# Patient Record
Sex: Female | Born: 1935 | Race: White | Hispanic: No | Smoking: Former smoker
Health system: Southern US, Community
[De-identification: ages and names within clinical notes are randomized; demographics above are authoritative.]

## PROBLEM LIST (undated history)

## (undated) DIAGNOSIS — D649 Anemia, unspecified: Secondary | ICD-10-CM

## (undated) DIAGNOSIS — E119 Type 2 diabetes mellitus without complications: Secondary | ICD-10-CM

## (undated) DIAGNOSIS — E538 Deficiency of other specified B group vitamins: Secondary | ICD-10-CM

## (undated) DIAGNOSIS — R0600 Dyspnea, unspecified: Secondary | ICD-10-CM

## (undated) DIAGNOSIS — F419 Anxiety disorder, unspecified: Secondary | ICD-10-CM

## (undated) DIAGNOSIS — R296 Repeated falls: Secondary | ICD-10-CM

## (undated) DIAGNOSIS — R262 Difficulty in walking, not elsewhere classified: Secondary | ICD-10-CM

## (undated) DIAGNOSIS — G4700 Insomnia, unspecified: Secondary | ICD-10-CM

## (undated) DIAGNOSIS — C946 Myelodysplastic disease, not classified: Secondary | ICD-10-CM

## (undated) DIAGNOSIS — D630 Anemia in neoplastic disease: Secondary | ICD-10-CM

## (undated) DIAGNOSIS — E785 Hyperlipidemia, unspecified: Secondary | ICD-10-CM

## (undated) DIAGNOSIS — U071 COVID-19: Secondary | ICD-10-CM

## (undated) DIAGNOSIS — D47Z9 Other specified neoplasms of uncertain behavior of lymphoid, hematopoietic and related tissue: Secondary | ICD-10-CM

## (undated) DIAGNOSIS — I10 Essential (primary) hypertension: Secondary | ICD-10-CM

## (undated) DIAGNOSIS — K5792 Diverticulitis of intestine, part unspecified, without perforation or abscess without bleeding: Secondary | ICD-10-CM

## (undated) DIAGNOSIS — D469 Myelodysplastic syndrome, unspecified: Secondary | ICD-10-CM

## (undated) DIAGNOSIS — L409 Psoriasis, unspecified: Secondary | ICD-10-CM

## (undated) DIAGNOSIS — M109 Gout, unspecified: Secondary | ICD-10-CM

## (undated) DIAGNOSIS — I350 Nonrheumatic aortic (valve) stenosis: Secondary | ICD-10-CM

## (undated) DIAGNOSIS — E559 Vitamin D deficiency, unspecified: Secondary | ICD-10-CM

## (undated) DIAGNOSIS — M818 Other osteoporosis without current pathological fracture: Secondary | ICD-10-CM

## (undated) DIAGNOSIS — J449 Chronic obstructive pulmonary disease, unspecified: Secondary | ICD-10-CM

## (undated) DIAGNOSIS — W19XXXA Unspecified fall, initial encounter: Secondary | ICD-10-CM

## (undated) DIAGNOSIS — N183 Chronic kidney disease, stage 3 unspecified: Secondary | ICD-10-CM

## (undated) DIAGNOSIS — I509 Heart failure, unspecified: Secondary | ICD-10-CM

## (undated) DIAGNOSIS — M545 Low back pain, unspecified: Secondary | ICD-10-CM

## (undated) HISTORY — DX: Myelodysplastic syndrome, unspecified: D46.9

## (undated) HISTORY — PX: CHOLECYSTECTOMY: SHX55

## (undated) HISTORY — DX: Myelodysplastic disease, not classified: C94.6

## (undated) HISTORY — DX: Other osteoporosis without current pathological fracture: M81.8

## (undated) HISTORY — DX: Anemia in neoplastic disease: D63.0

## (undated) HISTORY — DX: Anemia, unspecified: D64.9

## (undated) HISTORY — DX: Insomnia, unspecified: G47.00

## (undated) HISTORY — PX: SHOULDER SURGERY: SHX246

## (undated) HISTORY — DX: Chronic obstructive pulmonary disease, unspecified: J44.9

## (undated) HISTORY — DX: Hyperlipidemia, unspecified: E78.5

## (undated) HISTORY — DX: Psoriasis, unspecified: L40.9

## (undated) HISTORY — DX: Essential (primary) hypertension: I10

## (undated) HISTORY — DX: Deficiency of other specified B group vitamins: E53.8

## (undated) HISTORY — PX: SKIN BIOPSY: SHX1

## (undated) HISTORY — DX: Type 2 diabetes mellitus without complications: E11.9

## (undated) HISTORY — DX: Diverticulitis of intestine, part unspecified, without perforation or abscess without bleeding: K57.92

## (undated) HISTORY — DX: Nonrheumatic aortic (valve) stenosis: I35.0

## (undated) HISTORY — DX: Other specified neoplasms of uncertain behavior of lymphoid, hematopoietic and related tissue: D47.Z9

## (undated) HISTORY — DX: Anxiety disorder, unspecified: F41.9

## (undated) HISTORY — PX: JOINT REPLACEMENT: SHX530

## (undated) HISTORY — DX: Low back pain, unspecified: M54.50

## (undated) HISTORY — DX: Gout, unspecified: M10.9

## (undated) HISTORY — DX: Vitamin D deficiency, unspecified: E55.9

## (undated) HISTORY — DX: Myelodysplastic disease, not elsewhere classified: C94.6

## (undated) HISTORY — DX: Difficulty in walking, not elsewhere classified: R26.2

## (undated) HISTORY — PX: FOOT SURGERY: SHX648

## (undated) HISTORY — PX: ABDOMINAL HYSTERECTOMY: SHX81

## (undated) HISTORY — PX: CARPAL TUNNEL RELEASE: SHX101

## (undated) MED FILL — Luspatercept-aamt For Subcutaneous Inj 75 MG: SUBCUTANEOUS | Qty: 1.3 | Status: AC

---

## 2013-11-01 DIAGNOSIS — S2241XA Multiple fractures of ribs, right side, initial encounter for closed fracture: Secondary | ICD-10-CM

## 2013-11-01 DIAGNOSIS — J939 Pneumothorax, unspecified: Secondary | ICD-10-CM

## 2013-11-01 HISTORY — DX: Multiple fractures of ribs, right side, initial encounter for closed fracture: S22.41XA

## 2013-11-01 HISTORY — DX: Pneumothorax, unspecified: J93.9

## 2015-12-10 ENCOUNTER — Other Ambulatory Visit (HOSPITAL_COMMUNITY)
Admission: RE | Admit: 2015-12-10 | Disposition: A | Discharge status: Home / Self Care | Payer: Self-pay | Source: Ambulatory Visit | Attending: Oncology | Admitting: Oncology

## 2015-12-10 LAB — BONE MARROW EXAM

## 2015-12-17 LAB — CHROMOSOME ANALYSIS, BONE MARROW

## 2015-12-23 ENCOUNTER — Encounter (HOSPITAL_COMMUNITY): Payer: Self-pay

## 2015-12-26 DIAGNOSIS — D461 Refractory anemia with ring sideroblasts: Secondary | ICD-10-CM | POA: Diagnosis not present

## 2016-03-25 DIAGNOSIS — D461 Refractory anemia with ring sideroblasts: Secondary | ICD-10-CM | POA: Diagnosis not present

## 2016-06-21 DIAGNOSIS — D4620 Refractory anemia with excess of blasts, unspecified: Secondary | ICD-10-CM | POA: Diagnosis not present

## 2016-08-31 DIAGNOSIS — Z724 Inappropriate diet and eating habits: Secondary | ICD-10-CM | POA: Diagnosis not present

## 2016-08-31 DIAGNOSIS — D461 Refractory anemia with ring sideroblasts: Secondary | ICD-10-CM | POA: Diagnosis not present

## 2016-08-31 DIAGNOSIS — I951 Orthostatic hypotension: Secondary | ICD-10-CM | POA: Diagnosis not present

## 2016-10-12 DIAGNOSIS — D461 Refractory anemia with ring sideroblasts: Secondary | ICD-10-CM | POA: Diagnosis not present

## 2017-01-04 DIAGNOSIS — D461 Refractory anemia with ring sideroblasts: Secondary | ICD-10-CM | POA: Diagnosis not present

## 2017-01-24 DIAGNOSIS — L0390 Cellulitis, unspecified: Secondary | ICD-10-CM

## 2017-01-24 DIAGNOSIS — L409 Psoriasis, unspecified: Secondary | ICD-10-CM | POA: Diagnosis not present

## 2017-01-24 DIAGNOSIS — D649 Anemia, unspecified: Secondary | ICD-10-CM | POA: Diagnosis not present

## 2017-01-25 DIAGNOSIS — L0390 Cellulitis, unspecified: Secondary | ICD-10-CM | POA: Diagnosis not present

## 2017-01-25 DIAGNOSIS — L409 Psoriasis, unspecified: Secondary | ICD-10-CM | POA: Diagnosis not present

## 2017-01-25 DIAGNOSIS — D649 Anemia, unspecified: Secondary | ICD-10-CM | POA: Diagnosis not present

## 2017-01-26 DIAGNOSIS — L409 Psoriasis, unspecified: Secondary | ICD-10-CM | POA: Diagnosis not present

## 2017-01-26 DIAGNOSIS — L0390 Cellulitis, unspecified: Secondary | ICD-10-CM | POA: Diagnosis not present

## 2017-01-26 DIAGNOSIS — D649 Anemia, unspecified: Secondary | ICD-10-CM | POA: Diagnosis not present

## 2017-03-31 DIAGNOSIS — D461 Refractory anemia with ring sideroblasts: Secondary | ICD-10-CM | POA: Diagnosis not present

## 2017-06-17 DIAGNOSIS — D649 Anemia, unspecified: Secondary | ICD-10-CM | POA: Diagnosis not present

## 2017-06-17 DIAGNOSIS — K5732 Diverticulitis of large intestine without perforation or abscess without bleeding: Secondary | ICD-10-CM | POA: Diagnosis not present

## 2017-06-17 DIAGNOSIS — A419 Sepsis, unspecified organism: Secondary | ICD-10-CM

## 2017-06-17 DIAGNOSIS — R509 Fever, unspecified: Secondary | ICD-10-CM | POA: Diagnosis not present

## 2017-06-17 DIAGNOSIS — L409 Psoriasis, unspecified: Secondary | ICD-10-CM | POA: Diagnosis not present

## 2017-06-17 DIAGNOSIS — D72825 Bandemia: Secondary | ICD-10-CM | POA: Diagnosis not present

## 2017-06-17 DIAGNOSIS — D729 Disorder of white blood cells, unspecified: Secondary | ICD-10-CM

## 2017-06-18 DIAGNOSIS — R509 Fever, unspecified: Secondary | ICD-10-CM | POA: Diagnosis not present

## 2017-06-18 DIAGNOSIS — A419 Sepsis, unspecified organism: Secondary | ICD-10-CM | POA: Diagnosis not present

## 2017-06-18 DIAGNOSIS — K5732 Diverticulitis of large intestine without perforation or abscess without bleeding: Secondary | ICD-10-CM

## 2017-06-18 DIAGNOSIS — D729 Disorder of white blood cells, unspecified: Secondary | ICD-10-CM | POA: Diagnosis not present

## 2017-06-18 DIAGNOSIS — D72825 Bandemia: Secondary | ICD-10-CM | POA: Diagnosis not present

## 2017-06-19 DIAGNOSIS — D72825 Bandemia: Secondary | ICD-10-CM | POA: Diagnosis not present

## 2017-06-19 DIAGNOSIS — A419 Sepsis, unspecified organism: Secondary | ICD-10-CM | POA: Diagnosis not present

## 2017-06-19 DIAGNOSIS — D729 Disorder of white blood cells, unspecified: Secondary | ICD-10-CM | POA: Diagnosis not present

## 2017-06-19 DIAGNOSIS — R509 Fever, unspecified: Secondary | ICD-10-CM | POA: Diagnosis not present

## 2017-06-20 DIAGNOSIS — D729 Disorder of white blood cells, unspecified: Secondary | ICD-10-CM | POA: Diagnosis not present

## 2017-06-20 DIAGNOSIS — R509 Fever, unspecified: Secondary | ICD-10-CM | POA: Diagnosis not present

## 2017-06-20 DIAGNOSIS — A419 Sepsis, unspecified organism: Secondary | ICD-10-CM | POA: Diagnosis not present

## 2017-06-20 DIAGNOSIS — D72825 Bandemia: Secondary | ICD-10-CM | POA: Diagnosis not present

## 2017-06-30 DIAGNOSIS — D461 Refractory anemia with ring sideroblasts: Secondary | ICD-10-CM

## 2017-09-22 DIAGNOSIS — D461 Refractory anemia with ring sideroblasts: Secondary | ICD-10-CM | POA: Diagnosis not present

## 2017-11-17 DIAGNOSIS — D461 Refractory anemia with ring sideroblasts: Secondary | ICD-10-CM | POA: Diagnosis not present

## 2017-11-28 DIAGNOSIS — R55 Syncope and collapse: Secondary | ICD-10-CM | POA: Diagnosis not present

## 2017-11-28 DIAGNOSIS — L409 Psoriasis, unspecified: Secondary | ICD-10-CM | POA: Diagnosis not present

## 2017-11-28 DIAGNOSIS — I10 Essential (primary) hypertension: Secondary | ICD-10-CM | POA: Diagnosis not present

## 2017-11-28 DIAGNOSIS — R5381 Other malaise: Secondary | ICD-10-CM | POA: Diagnosis not present

## 2017-11-28 DIAGNOSIS — E119 Type 2 diabetes mellitus without complications: Secondary | ICD-10-CM | POA: Diagnosis not present

## 2017-11-28 DIAGNOSIS — N390 Urinary tract infection, site not specified: Secondary | ICD-10-CM | POA: Diagnosis not present

## 2017-11-28 DIAGNOSIS — R29898 Other symptoms and signs involving the musculoskeletal system: Secondary | ICD-10-CM | POA: Diagnosis not present

## 2017-11-28 DIAGNOSIS — Z9181 History of falling: Secondary | ICD-10-CM | POA: Diagnosis not present

## 2017-11-29 DIAGNOSIS — R29898 Other symptoms and signs involving the musculoskeletal system: Secondary | ICD-10-CM | POA: Diagnosis not present

## 2017-11-29 DIAGNOSIS — R55 Syncope and collapse: Secondary | ICD-10-CM | POA: Diagnosis not present

## 2017-11-29 DIAGNOSIS — E119 Type 2 diabetes mellitus without complications: Secondary | ICD-10-CM | POA: Diagnosis not present

## 2017-11-29 DIAGNOSIS — R5381 Other malaise: Secondary | ICD-10-CM | POA: Diagnosis not present

## 2017-11-29 DIAGNOSIS — Z9181 History of falling: Secondary | ICD-10-CM | POA: Diagnosis not present

## 2017-11-29 DIAGNOSIS — L409 Psoriasis, unspecified: Secondary | ICD-10-CM | POA: Diagnosis not present

## 2017-11-29 DIAGNOSIS — N390 Urinary tract infection, site not specified: Secondary | ICD-10-CM | POA: Diagnosis not present

## 2017-11-29 DIAGNOSIS — I10 Essential (primary) hypertension: Secondary | ICD-10-CM | POA: Diagnosis not present

## 2017-12-12 ENCOUNTER — Encounter: Payer: Self-pay | Admitting: *Deleted

## 2017-12-12 ENCOUNTER — Encounter: Payer: Self-pay | Admitting: Cardiology

## 2017-12-12 DIAGNOSIS — D469 Myelodysplastic syndrome, unspecified: Secondary | ICD-10-CM | POA: Insufficient documentation

## 2017-12-12 DIAGNOSIS — I10 Essential (primary) hypertension: Secondary | ICD-10-CM | POA: Insufficient documentation

## 2017-12-12 DIAGNOSIS — D649 Anemia, unspecified: Secondary | ICD-10-CM | POA: Insufficient documentation

## 2017-12-12 DIAGNOSIS — L409 Psoriasis, unspecified: Secondary | ICD-10-CM | POA: Insufficient documentation

## 2017-12-12 DIAGNOSIS — E119 Type 2 diabetes mellitus without complications: Secondary | ICD-10-CM | POA: Insufficient documentation

## 2017-12-12 DIAGNOSIS — I350 Nonrheumatic aortic (valve) stenosis: Secondary | ICD-10-CM | POA: Insufficient documentation

## 2017-12-12 DIAGNOSIS — J449 Chronic obstructive pulmonary disease, unspecified: Secondary | ICD-10-CM | POA: Insufficient documentation

## 2017-12-14 ENCOUNTER — Ambulatory Visit: Payer: Medicare Other | Admitting: Cardiology

## 2018-02-09 DIAGNOSIS — D461 Refractory anemia with ring sideroblasts: Secondary | ICD-10-CM | POA: Diagnosis not present

## 2018-05-09 DIAGNOSIS — D461 Refractory anemia with ring sideroblasts: Secondary | ICD-10-CM | POA: Diagnosis not present

## 2018-08-01 DIAGNOSIS — D461 Refractory anemia with ring sideroblasts: Secondary | ICD-10-CM | POA: Diagnosis not present

## 2018-10-24 DIAGNOSIS — D461 Refractory anemia with ring sideroblasts: Secondary | ICD-10-CM

## 2018-10-24 DIAGNOSIS — L03115 Cellulitis of right lower limb: Secondary | ICD-10-CM

## 2018-12-18 DIAGNOSIS — D461 Refractory anemia with ring sideroblasts: Secondary | ICD-10-CM | POA: Diagnosis not present

## 2019-03-13 DIAGNOSIS — D461 Refractory anemia with ring sideroblasts: Secondary | ICD-10-CM | POA: Diagnosis not present

## 2019-03-18 ENCOUNTER — Observation Stay (HOSPITAL_BASED_OUTPATIENT_CLINIC_OR_DEPARTMENT_OTHER): Payer: Medicare Other

## 2019-03-18 ENCOUNTER — Observation Stay (HOSPITAL_COMMUNITY)
Admission: AD | Admit: 2019-03-18 | Discharge: 2019-03-19 | Disposition: A | Discharge status: Home / Self Care | Payer: Medicare Other | Source: Other Acute Inpatient Hospital | Attending: Internal Medicine | Admitting: Internal Medicine

## 2019-03-18 ENCOUNTER — Encounter (HOSPITAL_COMMUNITY): Payer: Self-pay | Admitting: Internal Medicine

## 2019-03-18 DIAGNOSIS — Z833 Family history of diabetes mellitus: Secondary | ICD-10-CM | POA: Diagnosis not present

## 2019-03-18 DIAGNOSIS — R6889 Other general symptoms and signs: Secondary | ICD-10-CM

## 2019-03-18 DIAGNOSIS — Z66 Do not resuscitate: Secondary | ICD-10-CM | POA: Insufficient documentation

## 2019-03-18 DIAGNOSIS — J449 Chronic obstructive pulmonary disease, unspecified: Secondary | ICD-10-CM | POA: Insufficient documentation

## 2019-03-18 DIAGNOSIS — Z20822 Contact with and (suspected) exposure to covid-19: Secondary | ICD-10-CM | POA: Diagnosis present

## 2019-03-18 DIAGNOSIS — Z79899 Other long term (current) drug therapy: Secondary | ICD-10-CM | POA: Insufficient documentation

## 2019-03-18 DIAGNOSIS — F17200 Nicotine dependence, unspecified, uncomplicated: Secondary | ICD-10-CM | POA: Diagnosis not present

## 2019-03-18 DIAGNOSIS — I4891 Unspecified atrial fibrillation: Secondary | ICD-10-CM | POA: Diagnosis not present

## 2019-03-18 DIAGNOSIS — I350 Nonrheumatic aortic (valve) stenosis: Secondary | ICD-10-CM | POA: Diagnosis not present

## 2019-03-18 DIAGNOSIS — D469 Myelodysplastic syndrome, unspecified: Secondary | ICD-10-CM | POA: Insufficient documentation

## 2019-03-18 DIAGNOSIS — E119 Type 2 diabetes mellitus without complications: Secondary | ICD-10-CM

## 2019-03-18 DIAGNOSIS — L409 Psoriasis, unspecified: Secondary | ICD-10-CM | POA: Diagnosis not present

## 2019-03-18 DIAGNOSIS — I480 Paroxysmal atrial fibrillation: Secondary | ICD-10-CM | POA: Diagnosis not present

## 2019-03-18 DIAGNOSIS — Z8249 Family history of ischemic heart disease and other diseases of the circulatory system: Secondary | ICD-10-CM | POA: Insufficient documentation

## 2019-03-18 DIAGNOSIS — I10 Essential (primary) hypertension: Secondary | ICD-10-CM | POA: Insufficient documentation

## 2019-03-18 LAB — ECHOCARDIOGRAM COMPLETE

## 2019-03-18 LAB — HEMOGLOBIN A1C
Hgb A1c MFr Bld: 6.2 % — ABNORMAL HIGH (ref 4.8–5.6)
Mean Plasma Glucose: 131.24 mg/dL

## 2019-03-18 MED ORDER — TRIAMCINOLONE ACETONIDE 0.5 % EX OINT
TOPICAL_OINTMENT | Freq: Two times a day (BID) | CUTANEOUS | Status: DC | PRN
  Administered 2019-03-18: 16:00:00 via TOPICAL
  Filled 2019-03-18 (×2): qty 15

## 2019-03-18 MED ORDER — LISINOPRIL 5 MG PO TABS
5.0000 mg | ORAL_TABLET | Freq: Every day | ORAL | Status: DC
  Administered 2019-03-18 – 2019-03-19 (×2): 5 mg via ORAL
  Filled 2019-03-18 (×2): qty 1

## 2019-03-18 MED ORDER — TRIAMCINOLONE ACETONIDE 0.05 % EX OINT: TOPICAL_OINTMENT | Freq: Two times a day (BID) | CUTANEOUS | Status: DC | PRN

## 2019-03-18 MED ORDER — CLOBETASOL PROPIONATE 0.05 % EX CREA
TOPICAL_CREAM | Freq: Two times a day (BID) | CUTANEOUS | Status: DC | PRN
  Administered 2019-03-18: 16:00:00 via TOPICAL
  Filled 2019-03-18 (×2): qty 15

## 2019-03-18 MED ORDER — NICOTINE 14 MG/24HR TD PT24
14.0000 mg | MEDICATED_PATCH | Freq: Every day | TRANSDERMAL | Status: DC
  Filled 2019-03-18 (×2): qty 1

## 2019-03-18 MED ORDER — CLOBETASOL PROPIONATE EMULSION 0.05 % EX FOAM: 1.0000 "application " | Freq: Two times a day (BID) | CUTANEOUS | Status: DC | PRN

## 2019-03-18 MED ORDER — CLOBETASOL PROPIONATE 0.05 % EX CREA
TOPICAL_CREAM | Freq: Two times a day (BID) | CUTANEOUS | Status: DC
  Filled 2019-03-18: qty 15

## 2019-03-18 MED ORDER — ALBUTEROL SULFATE HFA 108 (90 BASE) MCG/ACT IN AERS
2.0000 | INHALATION_SPRAY | Freq: Four times a day (QID) | RESPIRATORY_TRACT | Status: DC | PRN
  Filled 2019-03-18: qty 6.7

## 2019-03-18 MED ORDER — MOMETASONE FURO-FORMOTEROL FUM 100-5 MCG/ACT IN AERO
2.0000 | INHALATION_SPRAY | Freq: Two times a day (BID) | RESPIRATORY_TRACT | Status: DC
  Administered 2019-03-18 – 2019-03-19 (×3): 2 via RESPIRATORY_TRACT
  Filled 2019-03-18: qty 8.8

## 2019-03-18 MED ORDER — ENOXAPARIN SODIUM 40 MG/0.4ML ~~LOC~~ SOLN
40.0000 mg | SUBCUTANEOUS | Status: DC
  Administered 2019-03-18 – 2019-03-19 (×2): 40 mg via SUBCUTANEOUS
  Filled 2019-03-18 (×3): qty 0.4

## 2019-03-18 MED ORDER — METOPROLOL SUCCINATE ER 50 MG PO TB24
50.0000 mg | ORAL_TABLET | Freq: Every day | ORAL | Status: DC
  Administered 2019-03-18: 50 mg via ORAL
  Filled 2019-03-18: qty 1

## 2019-03-18 MED ORDER — ONDANSETRON HCL 4 MG/2ML IJ SOLN
4.0000 mg | Freq: Four times a day (QID) | INTRAMUSCULAR | Status: DC | PRN
  Administered 2019-03-18: 4 mg via INTRAVENOUS
  Filled 2019-03-18 (×2): qty 2

## 2019-03-18 NOTE — H&P (Signed)
History and Physical    Stefanie Phelps HKV:[id number] DOB: 18-Jul-1930 DOA: 03/18/2019  PCP: Ocie Doyne., MD  Patient coming from: [facility].   I have personally briefly reviewed patient's old medical records available.   Chief Complaint: Weakness, difficulty breathing.  HPI: Stefanie Phelps is a 85 y.o. female with medical history significant of COPD, smoker, hypertension, myelodysplastic syndrome, psoriasis and currently on Humira injection, aortic stenosis who presented to the [facility] with weakness, found to have new onset A. fib, screen for COVID-19 and sent to the [facility] for COVID-19 cohort. Patient is hard of hearing, also discussed with her daughter over the phone to get more history. 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  She has been feeling weak last few days.  She was seen in the emergency room yesterday where she complained of feeling weak and feeling like passing out.  She was given Rocephin and discharged on ciprofloxacin.  Patient also complained of some shortness of breath.  She went home, felt more weaker and came back to emergency room.  A repeat evaluation was done with an EKG showed A. fib with heart rate of 114, no other abnormalities. No fever or chills.  On room air.  No wheezing.  She always have some congestion and dry cough because of COPD and a smoker.  Lives by herself.  Her daughter and son-in-law live nearby.  Nobody with COVID-19 exposure. Patient was given 10 mg of diltiazem in the ER, converted to sinus rhythm. COVID-19 sample was collected, patient was however stable on room air. Patient transferred [facility] because COVID-19 samples were collected. ED Course:  Afebrile.  On room air. Electrolytes normal.  Creatinine 1.2 which is at about her baseline. Urinalysis is essentially normal.  Lactic acid normal.  Calcium 10.  Troponin less than 0.01.  proBNP 613.  Repeat troponin less  than 0.01.  Magnesium 2.  TSH 1.25.  Free T4 1.3.  CT angiogram is normal.  No PE, no infiltrates, no consolidations. Hemoglobin 9.5.  Platelets 258.  WBC count is normal.  Differential is normal.    Review of Systems: As per HPI otherwise 10 point review of systems negative.    Past Medical History:  Diagnosis Date  . Anemia   . Aortic stenosis   . COPD (chronic obstructive pulmonary disease) ([place])   . Diabetes ([place])   . Hypertension   . Myelodysplastic syndrome ([place])   . Psoriasis     Past Surgical History:  Procedure Laterality Date  . ABDOMINAL HYSTERECTOMY    . CHOLECYSTECTOMY       reports that she has been smoking. She has never used smokeless tobacco. She reports that she does not drink alcohol or use drugs.  No Known Allergies  Family History  Problem Relation Age of Onset  . Hypertension Mother   . Diabetes Mother   . Heart disease Mother   . Hypertension Father   . Diabetes Father   . Prostate cancer Father   . Lung cancer Brother   . Heart disease Brother      Prior to Admission medications   Medication Sig Start Date End Date Taking? Authorizing Provider  Adalimumab 40 MG/0.4ML PNKT Inject 40 mg into the skin.    [provider]  cephALEXin (KEFLEX) 500 MG capsule Take 500 mg by mouth every 8 (eight) hours.    [provider]  CLOBETASOL PROPIONATE EMULSION EX  Apply topically 2 (two) times daily as needed.    [provider]  cyanocobalamin (,VITAMIN B-12,) 1000 MCG/ML injection Inject 1,000 mcg into the muscle every 30 (thirty) days.    [provider]  Fluticasone-Salmeterol (ADVAIR) 250-50 MCG/DOSE AEPB Inhale 1 puff into the lungs 2 (two) times daily.    [provider]  HYDROcodone-acetaminophen (NORCO) 10-325 MG tablet Take 1 tablet by mouth 4 (four) times daily as needed.    [provider]  loperamide (IMODIUM) 2 MG capsule Take 4 mg by mouth every 4 (four) hours as needed for diarrhea or  loose stools.    [provider]  ondansetron (ZOFRAN) 4 MG tablet Take 4 mg by mouth every 6 (six) hours as needed for nausea or vomiting.    [provider]  TRIAMCINOLONE ACETONIDE, TOP, 0.05 % OINT Apply topically 2 (two) times daily as needed.    [provider]  vitamin B-12 (CYANOCOBALAMIN) 1000 MCG tablet Take 1,000 mcg by mouth daily.    [provider]  Vitamin D, Ergocalciferol, (DRISDOL) 50000 units CAPS capsule Take 50,000 Units by mouth every 7 (seven) days.    [provider]    Physical Exam: Vitals:   03/18/19 1209  BP: (!) 141/48  Temp: 97.8 F (36.6 C)  TempSrc: Oral  SpO2: 95%    Constitutional: NAD, calm, comfortable Vitals:   03/18/19 1209  BP: (!) 141/48  Temp: 97.8 F (36.6 C)  TempSrc: Oral  SpO2: 95%   Eyes: PERRL, lids and conjunctivae normal Patient is comfortable on room air.  Not in any distress. ENMT: Mucous membranes are moist. Posterior pharynx clear of any exudate or lesions.Normal dentition.  Neck: normal, supple, no masses, no thyromegaly Respiratory: clear to auscultation bilaterally, no wheezing, no crackles. Normal respiratory effort. No accessory muscle use.  Cardiovascular: Regular rate and rhythm, no murmurs / rubs / gallops. No extremity edema. 2+ pedal pulses. No carotid bruits.  Abdomen: no tenderness, no masses palpated. No hepatosplenomegaly. Bowel sounds positive.  Musculoskeletal: no clubbing / cyanosis. No joint deformity upper and lower extremities. Good ROM, no contractures. Normal muscle tone.  Skin: no rashes, lesions, ulcers. No induration Neurologic: CN 2-12 grossly intact. Sensation intact, DTR normal. Strength 5/5 in all 4.  Psychiatric: Normal judgment and insight. Alert and oriented x 3. Normal mood.     Labs on Admission: I have personally reviewed following labs and imaging studies  CBC: No results for input(s): WBC, NEUTROABS, HGB, HCT, MCV, PLT in the last 168 hours.  Basic Metabolic Panel: No results for input(s): NA, K, CL, CO2, GLUCOSE, BUN, CREATININE, CALCIUM, MG, PHOS in the last 168 hours. GFR: CrCl cannot be calculated (No successful lab value found.). Liver Function Tests: No results for input(s): AST, ALT, ALKPHOS, BILITOT, PROT, ALBUMIN in the last 168 hours. No results for input(s): LIPASE, AMYLASE in the last 168 hours. No results for input(s): AMMONIA in the last 168 hours. Coagulation Profile: No results for input(s): INR, PROTIME in the last 168 hours. Cardiac Enzymes: No results for input(s): CKTOTAL, CKMB, CKMBINDEX, TROPONINI in the last 168 hours. BNP (last 3 results) No results for input(s): PROBNP in the last 8760 hours. HbA1C: No results for input(s): HGBA1C in the last 72 hours. CBG: No results for input(s): GLUCAP in the last 168 hours. Lipid Profile: No results for input(s): CHOL, HDL, LDLCALC, TRIG, CHOLHDL, LDLDIRECT in the last 72 hours. Thyroid Function Tests: No results for input(s): TSH, T4TOTAL, FREET4, T3FREE, THYROIDAB in  the last 72 hours. Anemia Panel: No results for input(s): VITAMINB12, FOLATE, FERRITIN, TIBC, IRON, RETICCTPCT in the last 72 hours. Urine analysis: No results found for: COLORURINE, APPEARANCEUR, LABSPEC, PHURINE, GLUCOSEU, HGBUR, BILIRUBINUR, KETONESUR, PROTEINUR, UROBILINOGEN, NITRITE, LEUKOCYTESUR  Radiological Exams on Admission: No results found.  EKG: Independently reviewed.   EKG from [facility] on 03/18/2019 shows Afib with VR of 114    EKG from [facility] for 05/02/2019 3 AM, sinus rhythm.  Assessment/Plan Principal Problem:   Atrial fibrillation with RVR (HCC) Active Problems:   Myelodysplastic syndrome (HCC)   Hypertension   COPD (chronic obstructive pulmonary disease) (HCC)   Aortic stenosis   Diabetes ([place])   Suspected Covid-19 Virus Infection   Smoker     1.  New onset A. fib:  Unknown whether patient has history of A. fib.  Patient is asymptomatic.   Denies any chest pain.  She does endorse occasional weakness and near syncopal episodes.  Will need to rule out symptomatic bradycardia or blockages. Admit to telemetry unit.  No evidence of acute coronary syndrome. Thyroid function panel was done and was normal. Currently rate controlled in sinus rhythm.  Will resume her home dose of metoprolol. We will need 2D echocardiogram for risk stratification. Diet-controlled diabetes, check A1c. No history of a stroke. Patient is not very interested to start anticoagulation, will risk stratify with A1c and 2D echocardiogram.  2.  COVID-19 suspected: Patient has no fever or URI symptoms.  She has no cough or pulmonary symptoms. Low risk COVID-19.  Contact and droplet precautions.  Close monitoring. Test sent from [facility] ER, will follow results.  3.  COPD with no exacerbation: Ongoing smoker. Nicotine patch.  Continue with steroid inhalers and albuterol inhaler.  Will avoid nebulizers. Counseled against smoking.  She is really not interested.  4.  Hypertension: Fairly stable.  Will resume home medications.  Discussed with patient's daughter over the phone. Patient does not want any aggressive treatment.  She even does not want to be treated even if she was found with cancers or any big diagnosis. She wants to be DNR/DNI.  DVT prophylaxis: Lovenox. Code Status: DNR Family Communication: Daughter on the phone Disposition Plan: Home. Consults called: None Admission status: Observation telemetry   Barb Merino MD [facility] Pager [phone]  If 7PM-7AM, please contact night-coverage [email or web address] Password [facility]  03/18/2019, 12:25 PM

## 2019-03-18 NOTE — Progress Notes (Addendum)
  Echocardiogram 2D Echocardiogram has been performed.  [name] 03/18/2019, 3:30 PM

## 2019-03-18 NOTE — Care Management (Signed)
This is a no charge note   Transfer from [facility] to [facility] tele per Dr. Elwyn Reach L. Monahan, Female, 85 y.o., March 29, 1930, MRN: [id number]  85 year old lady with past medical history of hypertension, diabetes mellitus, COPD, anemia, psoriasis, myelodysplastic syndrome, tobacco abuse, who presents with generalized weakness, dizziness and presyncope, found to have new onset A. fib with RVR.  Initially heart rate was 120-130.  Patient spontaneously converted to sinus rhythm, currently heart rate is about 60.  Patient also has shortness of breath, but no recent traveling or sick contact.  Patient goes to cancer center for treatment, ED physician is concerned for [place] and [place] test was sent out, therefore pt cannot be admitted to [facility].  Patient was found to have WBC 5.4, hemoglobin 9.5 which is close to baseline per ED physician, creatinine 1.2 which is at baseline per ED physician, temperature normal, blood pressure 190/90, RR 15, oxygen saturation 100% on room air.  CT angiogram is negative for PE and did not show infiltration.  Patient is accepted to telemetry bed for observation.   Please call manager of [facility] at [phone] when pt arrives to floor   Ivor Costa, MD  [facility]   If 7PM-7AM, please contact night-coverage [email or web address] Password [email or web address] 03/18/2019, 4:59 AM

## 2019-03-18 NOTE — Progress Notes (Addendum)
Pt has sudden onset of nausea, she did throw up once, stated feel like passing out.  RN put pt on 3L O2 via [place], BP stable, afib, HR 85. Paged Ghimire, MD and gave Zofran per order.  Pt stated she has had sudden onset of nausea in the past, going on for a few years, does not know why.  Pt felt better after receiving zofran. RN will continue to monitor closely.

## 2019-03-19 ENCOUNTER — Other Ambulatory Visit: Payer: Self-pay

## 2019-03-19 DIAGNOSIS — I10 Essential (primary) hypertension: Secondary | ICD-10-CM | POA: Diagnosis not present

## 2019-03-19 DIAGNOSIS — F17200 Nicotine dependence, unspecified, uncomplicated: Secondary | ICD-10-CM

## 2019-03-19 DIAGNOSIS — I4891 Unspecified atrial fibrillation: Secondary | ICD-10-CM | POA: Diagnosis not present

## 2019-03-19 DIAGNOSIS — I480 Paroxysmal atrial fibrillation: Secondary | ICD-10-CM | POA: Diagnosis not present

## 2019-03-19 DIAGNOSIS — J449 Chronic obstructive pulmonary disease, unspecified: Secondary | ICD-10-CM | POA: Diagnosis not present

## 2019-03-19 DIAGNOSIS — D469 Myelodysplastic syndrome, unspecified: Secondary | ICD-10-CM

## 2019-03-19 LAB — BASIC METABOLIC PANEL
Anion gap: 7 (ref 5–15)
BUN: 36 mg/dL — ABNORMAL HIGH (ref 8–23)
CO2: 22 mmol/L (ref 22–32)
Calcium: 9.6 mg/dL (ref 8.9–10.3)
Chloride: 108 mmol/L (ref 98–111)
Creatinine, Ser: 1.75 mg/dL — ABNORMAL HIGH (ref 0.44–1.00)
GFR calc Af Amer: 30 mL/min — ABNORMAL LOW (ref >60)
GFR calc non Af Amer: 26 mL/min — ABNORMAL LOW (ref >60)
Glucose, Bld: 106 mg/dL — ABNORMAL HIGH (ref 70–99)
Potassium: 4.5 mmol/L (ref 3.5–5.1)
Sodium: 137 mmol/L (ref 135–145)

## 2019-03-19 LAB — CBC
HCT: 29.8 % — ABNORMAL LOW (ref 36.0–46.0)
Hemoglobin: 9.5 g/dL — ABNORMAL LOW (ref 12.0–15.0)
MCH: 30.6 pg (ref 26.0–34.0)
MCHC: 31.9 g/dL (ref 30.0–36.0)
MCV: 96.1 fL (ref 80.0–100.0)
Platelets: 247 10*3/uL (ref 150–400)
RBC: 3.1 MIL/uL — ABNORMAL LOW (ref 3.87–5.11)
RDW: 17.8 % — ABNORMAL HIGH (ref 11.5–15.5)
WBC: 5.4 10*3/uL (ref 4.0–10.5)
nRBC: 0.4 % — ABNORMAL HIGH (ref 0.0–0.2)

## 2019-03-19 LAB — GLUCOSE, CAPILLARY: Glucose-Capillary: 91 mg/dL (ref 70–99)

## 2019-03-19 MED ORDER — METOPROLOL SUCCINATE ER 50 MG PO TB24: 50.0000 mg | ORAL_TABLET | Freq: Every day | ORAL | 0 refills | Status: DC

## 2019-03-19 MED ORDER — METOPROLOL TARTRATE 25 MG PO TABS
25.0000 mg | ORAL_TABLET | Freq: Two times a day (BID) | ORAL | Status: DC
  Administered 2019-03-19: 25 mg via ORAL
  Filled 2019-03-19: qty 1

## 2019-03-19 NOTE — Plan of Care (Signed)
Problem: Education: Goal: Knowledge of General Education information will improve Description Including pain rating scale, medication(s)/side effects and non-pharmacologic comfort measures 03/19/2019 1225 by Linton Flemings, RN Outcome: Adequate for Discharge 03/19/2019 1007 by Linton Flemings, RN Outcome: Adequate for Discharge 03/19/2019 1005 by Linton Flemings, RN Outcome: Progressing   Problem: Health Behavior/Discharge Planning: Goal: Ability to manage health-related needs will improve 03/19/2019 1225 by Linton Flemings, RN Outcome: Adequate for Discharge 03/19/2019 1007 by Linton Flemings, RN Outcome: Adequate for Discharge 03/19/2019 1005 by Linton Flemings, RN Outcome: Progressing   Problem: Clinical Measurements: Goal: Ability to maintain clinical measurements within normal limits will improve 03/19/2019 1225 by Linton Flemings, RN Outcome: Adequate for Discharge 03/19/2019 1007 by Linton Flemings, RN Outcome: Adequate for Discharge 03/19/2019 1005 by Linton Flemings, RN Outcome: Progressing Goal: Will remain free from infection 03/19/2019 1225 by Linton Flemings, RN Outcome: Adequate for Discharge 03/19/2019 1007 by Linton Flemings, RN Outcome: Adequate for Discharge 03/19/2019 1005 by Linton Flemings, RN Outcome: Progressing Goal: Diagnostic test results will improve 03/19/2019 1225 by Linton Flemings, RN Outcome: Adequate for Discharge 03/19/2019 1007 by Linton Flemings, RN Outcome: Adequate for Discharge 03/19/2019 1005 by Linton Flemings, RN Outcome: Progressing Goal: Respiratory complications will improve 03/19/2019 1225 by Linton Flemings, RN Outcome: Adequate for Discharge 03/19/2019 1007 by Linton Flemings, RN Outcome: Adequate for Discharge 03/19/2019 1005 by Linton Flemings, RN Outcome: Progressing Goal: Cardiovascular complication will be avoided 03/19/2019 1225 by Linton Flemings, RN Outcome: Adequate for Discharge 03/19/2019 1007 by Linton Flemings, RN Outcome: Adequate for  Discharge 03/19/2019 1005 by Linton Flemings, RN Outcome: Progressing   Problem: Activity: Goal: Risk for activity intolerance will decrease 03/19/2019 1225 by Linton Flemings, RN Outcome: Adequate for Discharge 03/19/2019 1007 by Linton Flemings, RN Outcome: Adequate for Discharge 03/19/2019 1005 by Linton Flemings, RN Outcome: Progressing   Problem: Nutrition: Goal: Adequate nutrition will be maintained 03/19/2019 1225 by Linton Flemings, RN Outcome: Adequate for Discharge 03/19/2019 1007 by Linton Flemings, RN Outcome: Adequate for Discharge 03/19/2019 1005 by Linton Flemings, RN Outcome: Progressing   Problem: Coping: Goal: Level of anxiety will decrease 03/19/2019 1225 by Linton Flemings, RN Outcome: Adequate for Discharge 03/19/2019 1007 by Linton Flemings, RN Outcome: Adequate for Discharge 03/19/2019 1005 by Linton Flemings, RN Outcome: Progressing   Problem: Elimination: Goal: Will not experience complications related to bowel motility 03/19/2019 1225 by Linton Flemings, RN Outcome: Adequate for Discharge 03/19/2019 1007 by Linton Flemings, RN Outcome: Adequate for Discharge 03/19/2019 1005 by Linton Flemings, RN Outcome: Progressing Goal: Will not experience complications related to urinary retention 03/19/2019 1225 by Linton Flemings, RN Outcome: Adequate for Discharge 03/19/2019 1007 by Linton Flemings, RN Outcome: Adequate for Discharge 03/19/2019 1005 by Linton Flemings, RN Outcome: Progressing   Problem: Pain Managment: Goal: General experience of comfort will improve 03/19/2019 1225 by Linton Flemings, RN Outcome: Adequate for Discharge 03/19/2019 1007 by Linton Flemings, RN Outcome: Adequate for Discharge 03/19/2019 1005 by Linton Flemings, RN Outcome: Progressing   Problem: Safety: Goal: Ability to remain free from injury will improve 03/19/2019 1225 by Linton Flemings, RN Outcome: Adequate for Discharge 03/19/2019 1007 by Linton Flemings, RN Outcome: Adequate for  Discharge 03/19/2019 1005 by Linton Flemings, RN Outcome: Progressing   Problem: Skin Integrity: Goal: Risk for impaired skin integrity will  decrease 03/19/2019 1225 by Linton Flemings, RN Outcome: Adequate for Discharge 03/19/2019 1007 by Linton Flemings, RN Outcome: Adequate for Discharge 03/19/2019 1005 by Linton Flemings, RN Outcome: Progressing

## 2019-03-19 NOTE — Care Management Obs Status (Signed)
[place] NOTIFICATION   Patient Details  Name: Stefanie Phelps MRN: [id number] Date of Birth: [birth year]   Medicare Observation Status Notification Given:  Yes    Zenon Mayo, RN 03/19/2019, 11:15 AM

## 2019-03-19 NOTE — Discharge Summary (Signed)
Physician Discharge Summary  Darothy Courtright JME:[id number] DOB: Aug 29, 1930 DOA: 03/18/2019  PCP: Ocie Doyne., MD  Admit date: 03/18/2019 Discharge date: 03/19/2019  Admitted From: Observation Disposition: home  Recommendations for Outpatient Follow-up:  1. Follow up with PCP in 1-2 weeks 2. Please obtain BMP/CBC in one week 3. Please follow up on the following pending results:  Home Health:No Equipment/Devices:no  Discharge Condition:Stable CODE STATUS:DNR Diet recommendation: Cardiac diet  Brief/Interim Summary: Per admitting physician Ajee Starlit Raburn is a 85 y.o. female with medical history significant of COPD, smoker, hypertension, myelodysplastic syndrome, psoriasis and currently on Humira injection, aortic stenosis who presented to the [facility] with weakness, found to have new onset A. fib, screen for COVID-19 and sent to the [facility] for COVID-19 cohort. Patient is hard of hearing, also discussed with her daughter over the phone to get more history. 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  She has been feeling weak last few days.  She was seen in the emergency room yesterday where she complained of feeling weak and feeling like passing out.  She was given Rocephin and discharged on ciprofloxacin.  Patient also complained of some shortness of breath.  She went home, felt more weaker and came back to emergency room.  A repeat evaluation was done with an EKG showed A. fib with heart rate of 114, no other abnormalities. No fever or chills.  On room air.  No wheezing.  She always have some congestion and dry cough because of COPD and a smoker.  Lives by herself.  Her daughter and son-in-law live nearby.  Nobody with COVID-19 exposure. Patient was given 10 mg of diltiazem in the ER, converted to sinus rhythm. COVID-19 sample was collected, patient was however stable on room air. Patient transferred to [facility] because  COVID-19 samples were collected. ED Course:  Afebrile.  On room air. Electrolytes normal.  Creatinine 1.2 which is at about her baseline. Urinalysis is essentially normal.  Lactic acid normal.  Calcium 10.  Troponin less than 0.01.  proBNP 613.  Repeat troponin less than 0.01.  Magnesium 2.  TSH 1.25.  Free T4 1.3.  CT angiogram is normal.  No PE, no infiltrates, no consolidations. Hemoglobin 9.5.  Platelets 258.  WBC count is normal.  Differential is normal.  Hospital course: Patient was a transfer from [facility] where she presented with shortness of breath.  Initially she was seen earlier today at [facility] ER and discharged home on ciprofloxacin.  She felt more weak at home and represented to the ER was found to be in A. fib with RVR with a rate of 114.  This is noted above in the admitting history and physical.  Apparently patient converted to normal sinus rhythm at outside facility but subsequently presented to [facility] because they had collected a COVID-19 sample for rule out.  Here with: Patient was heart rate was well controlled and we continued a beta-blocker and increased to 50 long-acting daily.  She was on room air without any respiratory compromise she does have a chronic cough and chronic shortness of breath because she has a COPD patient who continues to smoke.  An echo was done which showed normal EF 55 to 60% with mild this diastolic dysfunction..  Patient was stable be discharged home she was instructed on quarantine for 14 days and follow-up call for testing.  Discharge Diagnoses:  Principal Problem:   Atrial fibrillation with  RVR ([place]) Active Problems:   Myelodysplastic syndrome (HCC)   Hypertension   COPD (chronic obstructive pulmonary disease) ([place])   Aortic stenosis   Diabetes ([place])   Suspected Covid-19 Virus Infection   Smoker    Discharge Instructions  Discharge Instructions    Call MD for:   Complete by:  As directed    [place]   Call MD for:  difficulty breathing, headache or visual disturbances   Complete by:  As directed    Call MD for:  persistant nausea and vomiting   Complete by:  As directed    Call MD for:  severe uncontrolled pain   Complete by:  As directed    Call MD for:  temperature >100.4   Complete by:  As directed    Diet - low sodium heart healthy   Complete by:  As directed    Increase activity slowly   Complete by:  As directed      Allergies as of 03/19/2019   No Known Allergies     Medication List    TAKE these medications   Adalimumab 40 MG/0.4ML Pnkt Inject 40 mg into the skin every Sunday.   allopurinol 300 MG tablet Commonly known as:  ZYLOPRIM Take 300 mg by mouth daily.   B-12 Compliance Injection 1000 MCG/ML Kit Generic drug:  Cyanocobalamin Inject 1,000 mcg as directed every 30 (thirty) days.   CLOBETASOL PROPIONATE EMULSION EX Apply 1 application topically 2 (two) times daily as needed (legs).   enalapril 20 MG tablet Commonly known as:  VASOTEC Take 20 mg by mouth daily.   Fluticasone-Salmeterol 250-50 MCG/DOSE Aepb Commonly known as:  ADVAIR Inhale 1 puff into the lungs 2 (two) times daily.   furosemide 20 MG tablet Commonly known as:  LASIX Take 20 mg by mouth daily.   hydrALAZINE 25 MG tablet Commonly known as:  APRESOLINE Take 25 mg by mouth 2 (two) times daily.   HYDROcodone-acetaminophen 10-325 MG tablet Commonly known as:  NORCO Take 1 tablet by mouth 4 (four) times daily as needed.   lovastatin 40 MG tablet Commonly known as:  MEVACOR Take 40 mg by mouth daily.   metoprolol succinate 50 MG 24 hr tablet Commonly known as:  TOPROL-XL Take 1 tablet (50 mg total) by mouth daily for 30 days. What changed:    medication strength  how much to take   TRIAMCINOLONE ACETONIDE (TOP) 0.05 % Oint Apply 1 application topically 2 (two) times daily as needed (legs).   Vitamin D (Ergocalciferol) 1.25 MG (50000 UT) Caps capsule Commonly  known as:  DRISDOL Take 50,000 Units by mouth every Sunday.       No Known Allergies  Consultations:  None   Procedures/Studies:  No results found.    Subjective:   Discharge Exam: Vitals:   03/18/19 2100 03/19/19 0754  BP: 115/65 116/87  Pulse: 66 (!) 59  Resp: 16 20  Temp: 98.4 F (36.9 C) 97.8 F (36.6 C)  SpO2: 100% 97%   Vitals:   03/18/19 1714 03/18/19 1736 03/18/19 2100 03/19/19 0754  BP:  (!) 111/54 115/65 116/87  Pulse:   66 (!) 59  Resp:   16 20  Temp: 98.1 F (36.7 C)  98.4 F (36.9 C) 97.8 F (36.6 C)  TempSrc: Oral  Oral Oral  SpO2:  100% 100% 97%    General: Pt is alert, awake, not in acute distress Cardiovascular: RRR, S1/S2 +, no rubs, no gallops Respiratory:  CTA bilaterally, no wheezing, no rhonchi, no coughing or sob on my eval Abdominal: Soft, NT, ND, bowel sounds + Extremities: no edema, no cyanosis    The results of significant diagnostics from this hospitalization (including imaging, microbiology, ancillary and laboratory) are listed below for reference.     Microbiology: No results found for this or any previous visit (from the past 240 hour(s)).   Labs: BNP (last 3 results) No results for input(s): BNP in the last 8760 hours. Basic Metabolic Panel: Recent Labs  Lab 03/19/19 0740  NA 137  K 4.5  CL 108  CO2 22  GLUCOSE 106*  BUN 36*  CREATININE 1.75*  CALCIUM 9.6   Liver Function Tests: No results for input(s): AST, ALT, ALKPHOS, BILITOT, PROT, ALBUMIN in the last 168 hours. No results for input(s): LIPASE, AMYLASE in the last 168 hours. No results for input(s): AMMONIA in the last 168 hours. CBC: Recent Labs  Lab 03/19/19 0740  WBC 5.4  HGB 9.5*  HCT 29.8*  MCV 96.1  PLT 247   Cardiac Enzymes: No results for input(s): CKTOTAL, CKMB, CKMBINDEX, TROPONINI in the last 168 hours. BNP: Invalid input(s): POCBNP CBG: Recent Labs  Lab 03/19/19 0747  GLUCAP 91   D-Dimer No results for input(s): DDIMER  in the last 72 hours. Hgb A1c Recent Labs    03/18/19 1234  HGBA1C 6.2*   Lipid Profile No results for input(s): CHOL, HDL, LDLCALC, TRIG, CHOLHDL, LDLDIRECT in the last 72 hours. Thyroid function studies No results for input(s): TSH, T4TOTAL, T3FREE, THYROIDAB in the last 72 hours.  Invalid input(s): FREET3 Anemia work up No results for input(s): VITAMINB12, FOLATE, FERRITIN, TIBC, IRON, RETICCTPCT in the last 72 hours. Urinalysis No results found for: COLORURINE, APPEARANCEUR, LABSPEC, [place], GLUCOSEU, HGBUR, BILIRUBINUR, KETONESUR, PROTEINUR, UROBILINOGEN, NITRITE, LEUKOCYTESUR Sepsis Labs Invalid input(s): PROCALCITONIN,  WBC,  LACTICIDVEN Microbiology No results found for this or any previous visit (from the past 240 hour(s)).   Time coordinating discharge: Over 30 minutes  SIGNED:   Nicolette Bang, MD  [facility] 03/19/2019, 11:03 AM Pager   If 7PM-7AM, please contact night-coverage [email or web address] Password [email or web address]

## 2019-03-19 NOTE — Plan of Care (Signed)
  Problem: Education: Goal: Knowledge of General Education information will improve Description Including pain rating scale, medication(s)/side effects and non-pharmacologic comfort measures 03/19/2019 1007 by Linton Flemings, RN Outcome: Adequate for Discharge 03/19/2019 1005 by Linton Flemings, RN Outcome: Progressing   Problem: Health Behavior/Discharge Planning: Goal: Ability to manage health-related needs will improve 03/19/2019 1007 by Linton Flemings, RN Outcome: Adequate for Discharge 03/19/2019 1005 by Linton Flemings, RN Outcome: Progressing   Problem: Clinical Measurements: Goal: Ability to maintain clinical measurements within normal limits will improve 03/19/2019 1007 by Linton Flemings, RN Outcome: Adequate for Discharge 03/19/2019 1005 by Linton Flemings, RN Outcome: Progressing Goal: Will remain free from infection 03/19/2019 1007 by Linton Flemings, RN Outcome: Adequate for Discharge 03/19/2019 1005 by Linton Flemings, RN Outcome: Progressing Goal: Diagnostic test results will improve 03/19/2019 1007 by Linton Flemings, RN Outcome: Adequate for Discharge 03/19/2019 1005 by Linton Flemings, RN Outcome: Progressing Goal: Respiratory complications will improve 03/19/2019 1007 by Linton Flemings, RN Outcome: Adequate for Discharge 03/19/2019 1005 by Linton Flemings, RN Outcome: Progressing Goal: Cardiovascular complication will be avoided 03/19/2019 1007 by Linton Flemings, RN Outcome: Adequate for Discharge 03/19/2019 1005 by Linton Flemings, RN Outcome: Progressing   Problem: Activity: Goal: Risk for activity intolerance will decrease 03/19/2019 1007 by Linton Flemings, RN Outcome: Adequate for Discharge 03/19/2019 1005 by Linton Flemings, RN Outcome: Progressing   Problem: Nutrition: Goal: Adequate nutrition will be maintained 03/19/2019 1007 by Linton Flemings, RN Outcome: Adequate for Discharge 03/19/2019 1005 by Linton Flemings, RN Outcome: Progressing   Problem:  Coping: Goal: Level of anxiety will decrease 03/19/2019 1007 by Linton Flemings, RN Outcome: Adequate for Discharge 03/19/2019 1005 by Linton Flemings, RN Outcome: Progressing   Problem: Elimination: Goal: Will not experience complications related to bowel motility 03/19/2019 1007 by Linton Flemings, RN Outcome: Adequate for Discharge 03/19/2019 1005 by Linton Flemings, RN Outcome: Progressing Goal: Will not experience complications related to urinary retention 03/19/2019 1007 by Linton Flemings, RN Outcome: Adequate for Discharge 03/19/2019 1005 by Linton Flemings, RN Outcome: Progressing   Problem: Pain Managment: Goal: General experience of comfort will improve 03/19/2019 1007 by Linton Flemings, RN Outcome: Adequate for Discharge 03/19/2019 1005 by Linton Flemings, RN Outcome: Progressing   Problem: Safety: Goal: Ability to remain free from injury will improve 03/19/2019 1007 by Linton Flemings, RN Outcome: Adequate for Discharge 03/19/2019 1005 by Linton Flemings, RN Outcome: Progressing   Problem: Skin Integrity: Goal: Risk for impaired skin integrity will decrease 03/19/2019 1007 by Linton Flemings, RN Outcome: Adequate for Discharge 03/19/2019 1005 by Linton Flemings, RN Outcome: Progressing

## 2019-03-19 NOTE — Plan of Care (Signed)

## 2019-03-19 NOTE — Progress Notes (Signed)
Patient discharged home with family, discharged instruction given to patient, education given on COVID 69 quarantine until the result back. All questions answered and concerns addressed. Patient verbalized understanding. Patient was given a surgical mask for transfer to her car.

## 2019-06-05 DIAGNOSIS — D461 Refractory anemia with ring sideroblasts: Secondary | ICD-10-CM

## 2019-07-31 DIAGNOSIS — C946 Myelodysplastic disease, not classified: Secondary | ICD-10-CM

## 2019-07-31 DIAGNOSIS — D649 Anemia, unspecified: Secondary | ICD-10-CM | POA: Diagnosis not present

## 2019-12-20 DIAGNOSIS — E538 Deficiency of other specified B group vitamins: Secondary | ICD-10-CM | POA: Diagnosis not present

## 2019-12-20 DIAGNOSIS — C946 Myelodysplastic disease, not classified: Secondary | ICD-10-CM | POA: Diagnosis not present

## 2019-12-20 DIAGNOSIS — E785 Hyperlipidemia, unspecified: Secondary | ICD-10-CM | POA: Diagnosis not present

## 2019-12-20 DIAGNOSIS — I10 Essential (primary) hypertension: Secondary | ICD-10-CM | POA: Diagnosis not present

## 2019-12-20 DIAGNOSIS — J449 Chronic obstructive pulmonary disease, unspecified: Secondary | ICD-10-CM | POA: Diagnosis not present

## 2019-12-20 DIAGNOSIS — I50810 Right heart failure, unspecified: Secondary | ICD-10-CM | POA: Diagnosis not present

## 2019-12-20 DIAGNOSIS — M109 Gout, unspecified: Secondary | ICD-10-CM | POA: Diagnosis not present

## 2019-12-20 DIAGNOSIS — N183 Chronic kidney disease, stage 3 unspecified: Secondary | ICD-10-CM | POA: Diagnosis not present

## 2019-12-20 DIAGNOSIS — L4050 Arthropathic psoriasis, unspecified: Secondary | ICD-10-CM | POA: Diagnosis not present

## 2019-12-27 DIAGNOSIS — E559 Vitamin D deficiency, unspecified: Secondary | ICD-10-CM | POA: Diagnosis not present

## 2019-12-27 DIAGNOSIS — D461 Refractory anemia with ring sideroblasts: Secondary | ICD-10-CM | POA: Diagnosis not present

## 2019-12-27 DIAGNOSIS — E119 Type 2 diabetes mellitus without complications: Secondary | ICD-10-CM | POA: Diagnosis not present

## 2019-12-27 DIAGNOSIS — E785 Hyperlipidemia, unspecified: Secondary | ICD-10-CM | POA: Diagnosis not present

## 2019-12-27 DIAGNOSIS — D649 Anemia, unspecified: Secondary | ICD-10-CM | POA: Diagnosis not present

## 2020-01-17 DIAGNOSIS — D461 Refractory anemia with ring sideroblasts: Secondary | ICD-10-CM | POA: Diagnosis not present

## 2020-01-21 DIAGNOSIS — J449 Chronic obstructive pulmonary disease, unspecified: Secondary | ICD-10-CM | POA: Diagnosis not present

## 2020-01-21 DIAGNOSIS — L4050 Arthropathic psoriasis, unspecified: Secondary | ICD-10-CM | POA: Diagnosis not present

## 2020-01-21 DIAGNOSIS — M109 Gout, unspecified: Secondary | ICD-10-CM | POA: Diagnosis not present

## 2020-01-21 DIAGNOSIS — I50810 Right heart failure, unspecified: Secondary | ICD-10-CM | POA: Diagnosis not present

## 2020-01-21 DIAGNOSIS — E785 Hyperlipidemia, unspecified: Secondary | ICD-10-CM | POA: Diagnosis not present

## 2020-01-21 DIAGNOSIS — I10 Essential (primary) hypertension: Secondary | ICD-10-CM | POA: Diagnosis not present

## 2020-01-21 DIAGNOSIS — E538 Deficiency of other specified B group vitamins: Secondary | ICD-10-CM | POA: Diagnosis not present

## 2020-01-21 DIAGNOSIS — C946 Myelodysplastic disease, not classified: Secondary | ICD-10-CM | POA: Diagnosis not present

## 2020-01-21 DIAGNOSIS — N183 Chronic kidney disease, stage 3 unspecified: Secondary | ICD-10-CM | POA: Diagnosis not present

## 2020-01-22 DIAGNOSIS — H3530 Unspecified macular degeneration: Secondary | ICD-10-CM | POA: Diagnosis not present

## 2020-02-07 DIAGNOSIS — D461 Refractory anemia with ring sideroblasts: Secondary | ICD-10-CM | POA: Diagnosis not present

## 2020-02-18 DIAGNOSIS — N183 Chronic kidney disease, stage 3 unspecified: Secondary | ICD-10-CM | POA: Diagnosis not present

## 2020-02-18 DIAGNOSIS — M109 Gout, unspecified: Secondary | ICD-10-CM | POA: Diagnosis not present

## 2020-02-18 DIAGNOSIS — C946 Myelodysplastic disease, not classified: Secondary | ICD-10-CM | POA: Diagnosis not present

## 2020-02-18 DIAGNOSIS — J449 Chronic obstructive pulmonary disease, unspecified: Secondary | ICD-10-CM | POA: Diagnosis not present

## 2020-02-18 DIAGNOSIS — I50810 Right heart failure, unspecified: Secondary | ICD-10-CM | POA: Diagnosis not present

## 2020-02-18 DIAGNOSIS — E538 Deficiency of other specified B group vitamins: Secondary | ICD-10-CM | POA: Diagnosis not present

## 2020-02-18 DIAGNOSIS — E785 Hyperlipidemia, unspecified: Secondary | ICD-10-CM | POA: Diagnosis not present

## 2020-02-18 DIAGNOSIS — L4050 Arthropathic psoriasis, unspecified: Secondary | ICD-10-CM | POA: Diagnosis not present

## 2020-02-18 DIAGNOSIS — I10 Essential (primary) hypertension: Secondary | ICD-10-CM | POA: Diagnosis not present

## 2020-02-28 DIAGNOSIS — D461 Refractory anemia with ring sideroblasts: Secondary | ICD-10-CM | POA: Diagnosis not present

## 2020-03-13 DIAGNOSIS — H353132 Nonexudative age-related macular degeneration, bilateral, intermediate dry stage: Secondary | ICD-10-CM | POA: Diagnosis not present

## 2020-03-13 DIAGNOSIS — H35371 Puckering of macula, right eye: Secondary | ICD-10-CM | POA: Diagnosis not present

## 2020-03-20 DIAGNOSIS — I10 Essential (primary) hypertension: Secondary | ICD-10-CM | POA: Diagnosis not present

## 2020-03-20 DIAGNOSIS — E785 Hyperlipidemia, unspecified: Secondary | ICD-10-CM | POA: Diagnosis not present

## 2020-03-20 DIAGNOSIS — M545 Low back pain: Secondary | ICD-10-CM | POA: Diagnosis not present

## 2020-03-20 DIAGNOSIS — E538 Deficiency of other specified B group vitamins: Secondary | ICD-10-CM | POA: Diagnosis not present

## 2020-03-20 DIAGNOSIS — Z139 Encounter for screening, unspecified: Secondary | ICD-10-CM | POA: Diagnosis not present

## 2020-03-20 DIAGNOSIS — N183 Chronic kidney disease, stage 3 unspecified: Secondary | ICD-10-CM | POA: Diagnosis not present

## 2020-03-20 DIAGNOSIS — C946 Myelodysplastic disease, not classified: Secondary | ICD-10-CM | POA: Diagnosis not present

## 2020-03-20 DIAGNOSIS — D461 Refractory anemia with ring sideroblasts: Secondary | ICD-10-CM | POA: Diagnosis not present

## 2020-03-20 DIAGNOSIS — J449 Chronic obstructive pulmonary disease, unspecified: Secondary | ICD-10-CM | POA: Diagnosis not present

## 2020-03-20 DIAGNOSIS — D649 Anemia, unspecified: Secondary | ICD-10-CM | POA: Diagnosis not present

## 2020-03-20 DIAGNOSIS — L4050 Arthropathic psoriasis, unspecified: Secondary | ICD-10-CM | POA: Diagnosis not present

## 2020-04-10 DIAGNOSIS — D461 Refractory anemia with ring sideroblasts: Secondary | ICD-10-CM | POA: Diagnosis not present

## 2020-04-18 DIAGNOSIS — N183 Chronic kidney disease, stage 3 unspecified: Secondary | ICD-10-CM | POA: Diagnosis not present

## 2020-04-18 DIAGNOSIS — C946 Myelodysplastic disease, not classified: Secondary | ICD-10-CM | POA: Diagnosis not present

## 2020-04-18 DIAGNOSIS — I50810 Right heart failure, unspecified: Secondary | ICD-10-CM | POA: Diagnosis not present

## 2020-04-18 DIAGNOSIS — M109 Gout, unspecified: Secondary | ICD-10-CM | POA: Diagnosis not present

## 2020-04-18 DIAGNOSIS — E785 Hyperlipidemia, unspecified: Secondary | ICD-10-CM | POA: Diagnosis not present

## 2020-04-18 DIAGNOSIS — I10 Essential (primary) hypertension: Secondary | ICD-10-CM | POA: Diagnosis not present

## 2020-04-18 DIAGNOSIS — E538 Deficiency of other specified B group vitamins: Secondary | ICD-10-CM | POA: Diagnosis not present

## 2020-04-18 DIAGNOSIS — J449 Chronic obstructive pulmonary disease, unspecified: Secondary | ICD-10-CM | POA: Diagnosis not present

## 2020-04-18 DIAGNOSIS — L4050 Arthropathic psoriasis, unspecified: Secondary | ICD-10-CM | POA: Diagnosis not present

## 2020-04-21 DIAGNOSIS — Z79899 Other long term (current) drug therapy: Secondary | ICD-10-CM | POA: Diagnosis not present

## 2020-04-21 DIAGNOSIS — L0390 Cellulitis, unspecified: Secondary | ICD-10-CM | POA: Diagnosis not present

## 2020-04-21 DIAGNOSIS — L409 Psoriasis, unspecified: Secondary | ICD-10-CM | POA: Diagnosis not present

## 2020-05-01 DIAGNOSIS — C946 Myelodysplastic disease, not classified: Secondary | ICD-10-CM | POA: Diagnosis not present

## 2020-05-19 DIAGNOSIS — M109 Gout, unspecified: Secondary | ICD-10-CM | POA: Diagnosis not present

## 2020-05-19 DIAGNOSIS — L4050 Arthropathic psoriasis, unspecified: Secondary | ICD-10-CM | POA: Diagnosis not present

## 2020-05-19 DIAGNOSIS — I50810 Right heart failure, unspecified: Secondary | ICD-10-CM | POA: Diagnosis not present

## 2020-05-19 DIAGNOSIS — I10 Essential (primary) hypertension: Secondary | ICD-10-CM | POA: Diagnosis not present

## 2020-05-19 DIAGNOSIS — E538 Deficiency of other specified B group vitamins: Secondary | ICD-10-CM | POA: Diagnosis not present

## 2020-05-19 DIAGNOSIS — J449 Chronic obstructive pulmonary disease, unspecified: Secondary | ICD-10-CM | POA: Diagnosis not present

## 2020-05-19 DIAGNOSIS — E785 Hyperlipidemia, unspecified: Secondary | ICD-10-CM | POA: Diagnosis not present

## 2020-05-19 DIAGNOSIS — C946 Myelodysplastic disease, not classified: Secondary | ICD-10-CM | POA: Diagnosis not present

## 2020-05-19 DIAGNOSIS — N183 Chronic kidney disease, stage 3 unspecified: Secondary | ICD-10-CM | POA: Diagnosis not present

## 2020-05-22 DIAGNOSIS — D461 Refractory anemia with ring sideroblasts: Secondary | ICD-10-CM | POA: Diagnosis not present

## 2020-05-22 DIAGNOSIS — E0869 Diabetes mellitus due to underlying condition with other specified complication: Secondary | ICD-10-CM | POA: Diagnosis not present

## 2020-05-22 DIAGNOSIS — Z5189 Encounter for other specified aftercare: Secondary | ICD-10-CM | POA: Diagnosis not present

## 2020-05-22 DIAGNOSIS — E785 Hyperlipidemia, unspecified: Secondary | ICD-10-CM | POA: Diagnosis not present

## 2020-06-12 DIAGNOSIS — D649 Anemia, unspecified: Secondary | ICD-10-CM | POA: Diagnosis not present

## 2020-06-12 DIAGNOSIS — D461 Refractory anemia with ring sideroblasts: Secondary | ICD-10-CM | POA: Diagnosis not present

## 2020-06-18 DIAGNOSIS — I10 Essential (primary) hypertension: Secondary | ICD-10-CM | POA: Diagnosis not present

## 2020-06-18 DIAGNOSIS — J449 Chronic obstructive pulmonary disease, unspecified: Secondary | ICD-10-CM | POA: Diagnosis not present

## 2020-06-18 DIAGNOSIS — C946 Myelodysplastic disease, not classified: Secondary | ICD-10-CM | POA: Diagnosis not present

## 2020-06-18 DIAGNOSIS — E785 Hyperlipidemia, unspecified: Secondary | ICD-10-CM | POA: Diagnosis not present

## 2020-06-18 DIAGNOSIS — N183 Chronic kidney disease, stage 3 unspecified: Secondary | ICD-10-CM | POA: Diagnosis not present

## 2020-06-18 DIAGNOSIS — I50810 Right heart failure, unspecified: Secondary | ICD-10-CM | POA: Diagnosis not present

## 2020-06-18 DIAGNOSIS — L4050 Arthropathic psoriasis, unspecified: Secondary | ICD-10-CM | POA: Diagnosis not present

## 2020-06-18 DIAGNOSIS — M109 Gout, unspecified: Secondary | ICD-10-CM | POA: Diagnosis not present

## 2020-06-18 DIAGNOSIS — E538 Deficiency of other specified B group vitamins: Secondary | ICD-10-CM | POA: Diagnosis not present

## 2020-06-19 DIAGNOSIS — D461 Refractory anemia with ring sideroblasts: Secondary | ICD-10-CM | POA: Diagnosis not present

## 2020-07-06 DIAGNOSIS — M791 Myalgia, unspecified site: Secondary | ICD-10-CM | POA: Diagnosis not present

## 2020-07-06 DIAGNOSIS — N179 Acute kidney failure, unspecified: Secondary | ICD-10-CM | POA: Diagnosis not present

## 2020-07-06 DIAGNOSIS — E860 Dehydration: Secondary | ICD-10-CM | POA: Diagnosis not present

## 2020-07-07 DIAGNOSIS — N179 Acute kidney failure, unspecified: Secondary | ICD-10-CM | POA: Diagnosis not present

## 2020-07-07 DIAGNOSIS — F17210 Nicotine dependence, cigarettes, uncomplicated: Secondary | ICD-10-CM | POA: Diagnosis not present

## 2020-07-07 DIAGNOSIS — E876 Hypokalemia: Secondary | ICD-10-CM | POA: Diagnosis not present

## 2020-07-07 DIAGNOSIS — I10 Essential (primary) hypertension: Secondary | ICD-10-CM | POA: Diagnosis not present

## 2020-07-07 DIAGNOSIS — J449 Chronic obstructive pulmonary disease, unspecified: Secondary | ICD-10-CM | POA: Diagnosis not present

## 2020-07-07 DIAGNOSIS — R531 Weakness: Secondary | ICD-10-CM | POA: Diagnosis not present

## 2020-07-07 DIAGNOSIS — E860 Dehydration: Secondary | ICD-10-CM | POA: Diagnosis not present

## 2020-07-07 DIAGNOSIS — M109 Gout, unspecified: Secondary | ICD-10-CM | POA: Diagnosis not present

## 2020-07-07 DIAGNOSIS — M791 Myalgia, unspecified site: Secondary | ICD-10-CM | POA: Diagnosis not present

## 2020-07-07 DIAGNOSIS — E872 Acidosis: Secondary | ICD-10-CM | POA: Diagnosis not present

## 2020-07-07 DIAGNOSIS — R0602 Shortness of breath: Secondary | ICD-10-CM | POA: Diagnosis not present

## 2020-07-07 DIAGNOSIS — D469 Myelodysplastic syndrome, unspecified: Secondary | ICD-10-CM | POA: Diagnosis not present

## 2020-07-14 DIAGNOSIS — D461 Refractory anemia with ring sideroblasts: Secondary | ICD-10-CM | POA: Diagnosis not present

## 2020-07-14 DIAGNOSIS — D649 Anemia, unspecified: Secondary | ICD-10-CM | POA: Diagnosis not present

## 2020-07-17 DIAGNOSIS — D461 Refractory anemia with ring sideroblasts: Secondary | ICD-10-CM | POA: Diagnosis not present

## 2020-07-18 DIAGNOSIS — M109 Gout, unspecified: Secondary | ICD-10-CM | POA: Diagnosis not present

## 2020-07-18 DIAGNOSIS — R5381 Other malaise: Secondary | ICD-10-CM | POA: Diagnosis not present

## 2020-07-18 DIAGNOSIS — Z1331 Encounter for screening for depression: Secondary | ICD-10-CM | POA: Diagnosis not present

## 2020-07-18 DIAGNOSIS — Z9181 History of falling: Secondary | ICD-10-CM | POA: Diagnosis not present

## 2020-07-18 DIAGNOSIS — L4050 Arthropathic psoriasis, unspecified: Secondary | ICD-10-CM | POA: Diagnosis not present

## 2020-07-18 DIAGNOSIS — I50810 Right heart failure, unspecified: Secondary | ICD-10-CM | POA: Diagnosis not present

## 2020-07-18 DIAGNOSIS — Z79899 Other long term (current) drug therapy: Secondary | ICD-10-CM | POA: Diagnosis not present

## 2020-07-18 DIAGNOSIS — I10 Essential (primary) hypertension: Secondary | ICD-10-CM | POA: Diagnosis not present

## 2020-07-18 DIAGNOSIS — E785 Hyperlipidemia, unspecified: Secondary | ICD-10-CM | POA: Diagnosis not present

## 2020-07-30 DIAGNOSIS — M109 Gout, unspecified: Secondary | ICD-10-CM | POA: Diagnosis not present

## 2020-07-30 DIAGNOSIS — I4891 Unspecified atrial fibrillation: Secondary | ICD-10-CM | POA: Diagnosis not present

## 2020-07-30 DIAGNOSIS — G4700 Insomnia, unspecified: Secondary | ICD-10-CM | POA: Diagnosis not present

## 2020-07-30 DIAGNOSIS — D649 Anemia, unspecified: Secondary | ICD-10-CM | POA: Diagnosis not present

## 2020-07-30 DIAGNOSIS — D469 Myelodysplastic syndrome, unspecified: Secondary | ICD-10-CM | POA: Diagnosis not present

## 2020-07-30 DIAGNOSIS — I10 Essential (primary) hypertension: Secondary | ICD-10-CM | POA: Diagnosis not present

## 2020-07-30 DIAGNOSIS — J449 Chronic obstructive pulmonary disease, unspecified: Secondary | ICD-10-CM | POA: Diagnosis not present

## 2020-07-30 DIAGNOSIS — N200 Calculus of kidney: Secondary | ICD-10-CM | POA: Diagnosis not present

## 2020-07-30 DIAGNOSIS — E538 Deficiency of other specified B group vitamins: Secondary | ICD-10-CM | POA: Diagnosis not present

## 2020-08-04 DIAGNOSIS — I4891 Unspecified atrial fibrillation: Secondary | ICD-10-CM | POA: Diagnosis not present

## 2020-08-04 DIAGNOSIS — M109 Gout, unspecified: Secondary | ICD-10-CM | POA: Diagnosis not present

## 2020-08-04 DIAGNOSIS — I10 Essential (primary) hypertension: Secondary | ICD-10-CM | POA: Diagnosis not present

## 2020-08-04 DIAGNOSIS — D649 Anemia, unspecified: Secondary | ICD-10-CM | POA: Diagnosis not present

## 2020-08-04 DIAGNOSIS — D469 Myelodysplastic syndrome, unspecified: Secondary | ICD-10-CM | POA: Diagnosis not present

## 2020-08-04 DIAGNOSIS — G4700 Insomnia, unspecified: Secondary | ICD-10-CM | POA: Diagnosis not present

## 2020-08-04 DIAGNOSIS — E538 Deficiency of other specified B group vitamins: Secondary | ICD-10-CM | POA: Diagnosis not present

## 2020-08-04 DIAGNOSIS — N200 Calculus of kidney: Secondary | ICD-10-CM | POA: Diagnosis not present

## 2020-08-04 DIAGNOSIS — J449 Chronic obstructive pulmonary disease, unspecified: Secondary | ICD-10-CM | POA: Diagnosis not present

## 2020-08-06 DIAGNOSIS — M109 Gout, unspecified: Secondary | ICD-10-CM | POA: Diagnosis not present

## 2020-08-06 DIAGNOSIS — I4891 Unspecified atrial fibrillation: Secondary | ICD-10-CM | POA: Diagnosis not present

## 2020-08-06 DIAGNOSIS — D469 Myelodysplastic syndrome, unspecified: Secondary | ICD-10-CM | POA: Diagnosis not present

## 2020-08-06 DIAGNOSIS — N200 Calculus of kidney: Secondary | ICD-10-CM | POA: Diagnosis not present

## 2020-08-06 DIAGNOSIS — E538 Deficiency of other specified B group vitamins: Secondary | ICD-10-CM | POA: Diagnosis not present

## 2020-08-06 DIAGNOSIS — J449 Chronic obstructive pulmonary disease, unspecified: Secondary | ICD-10-CM | POA: Diagnosis not present

## 2020-08-06 DIAGNOSIS — I10 Essential (primary) hypertension: Secondary | ICD-10-CM | POA: Diagnosis not present

## 2020-08-06 DIAGNOSIS — D649 Anemia, unspecified: Secondary | ICD-10-CM | POA: Diagnosis not present

## 2020-08-06 DIAGNOSIS — G4700 Insomnia, unspecified: Secondary | ICD-10-CM | POA: Diagnosis not present

## 2020-08-08 DIAGNOSIS — D461 Refractory anemia with ring sideroblasts: Secondary | ICD-10-CM | POA: Diagnosis not present

## 2020-08-11 DIAGNOSIS — D649 Anemia, unspecified: Secondary | ICD-10-CM | POA: Diagnosis not present

## 2020-08-11 DIAGNOSIS — I10 Essential (primary) hypertension: Secondary | ICD-10-CM | POA: Diagnosis not present

## 2020-08-11 DIAGNOSIS — J449 Chronic obstructive pulmonary disease, unspecified: Secondary | ICD-10-CM | POA: Diagnosis not present

## 2020-08-11 DIAGNOSIS — D469 Myelodysplastic syndrome, unspecified: Secondary | ICD-10-CM | POA: Diagnosis not present

## 2020-08-11 DIAGNOSIS — M109 Gout, unspecified: Secondary | ICD-10-CM | POA: Diagnosis not present

## 2020-08-11 DIAGNOSIS — N200 Calculus of kidney: Secondary | ICD-10-CM | POA: Diagnosis not present

## 2020-08-11 DIAGNOSIS — G4700 Insomnia, unspecified: Secondary | ICD-10-CM | POA: Diagnosis not present

## 2020-08-11 DIAGNOSIS — E538 Deficiency of other specified B group vitamins: Secondary | ICD-10-CM | POA: Diagnosis not present

## 2020-08-11 DIAGNOSIS — I4891 Unspecified atrial fibrillation: Secondary | ICD-10-CM | POA: Diagnosis not present

## 2020-08-14 DIAGNOSIS — D649 Anemia, unspecified: Secondary | ICD-10-CM | POA: Diagnosis not present

## 2020-08-14 DIAGNOSIS — J449 Chronic obstructive pulmonary disease, unspecified: Secondary | ICD-10-CM | POA: Diagnosis not present

## 2020-08-14 DIAGNOSIS — D469 Myelodysplastic syndrome, unspecified: Secondary | ICD-10-CM | POA: Diagnosis not present

## 2020-08-14 DIAGNOSIS — E538 Deficiency of other specified B group vitamins: Secondary | ICD-10-CM | POA: Diagnosis not present

## 2020-08-14 DIAGNOSIS — G4700 Insomnia, unspecified: Secondary | ICD-10-CM | POA: Diagnosis not present

## 2020-08-14 DIAGNOSIS — I10 Essential (primary) hypertension: Secondary | ICD-10-CM | POA: Diagnosis not present

## 2020-08-14 DIAGNOSIS — N200 Calculus of kidney: Secondary | ICD-10-CM | POA: Diagnosis not present

## 2020-08-14 DIAGNOSIS — M109 Gout, unspecified: Secondary | ICD-10-CM | POA: Diagnosis not present

## 2020-08-14 DIAGNOSIS — I4891 Unspecified atrial fibrillation: Secondary | ICD-10-CM | POA: Diagnosis not present

## 2020-08-15 DIAGNOSIS — L03115 Cellulitis of right lower limb: Secondary | ICD-10-CM | POA: Diagnosis not present

## 2020-08-15 DIAGNOSIS — E538 Deficiency of other specified B group vitamins: Secondary | ICD-10-CM | POA: Diagnosis not present

## 2020-08-15 DIAGNOSIS — E118 Type 2 diabetes mellitus with unspecified complications: Secondary | ICD-10-CM | POA: Diagnosis not present

## 2020-08-15 DIAGNOSIS — Z79899 Other long term (current) drug therapy: Secondary | ICD-10-CM | POA: Diagnosis not present

## 2020-08-15 DIAGNOSIS — L4050 Arthropathic psoriasis, unspecified: Secondary | ICD-10-CM | POA: Diagnosis not present

## 2020-08-15 DIAGNOSIS — N183 Chronic kidney disease, stage 3 unspecified: Secondary | ICD-10-CM | POA: Diagnosis not present

## 2020-08-15 DIAGNOSIS — I10 Essential (primary) hypertension: Secondary | ICD-10-CM | POA: Diagnosis not present

## 2020-08-15 DIAGNOSIS — Z6820 Body mass index (BMI) 20.0-20.9, adult: Secondary | ICD-10-CM | POA: Diagnosis not present

## 2020-08-15 DIAGNOSIS — I50810 Right heart failure, unspecified: Secondary | ICD-10-CM | POA: Diagnosis not present

## 2020-08-19 DIAGNOSIS — J449 Chronic obstructive pulmonary disease, unspecified: Secondary | ICD-10-CM | POA: Diagnosis not present

## 2020-08-19 DIAGNOSIS — G4700 Insomnia, unspecified: Secondary | ICD-10-CM | POA: Diagnosis not present

## 2020-08-19 DIAGNOSIS — I10 Essential (primary) hypertension: Secondary | ICD-10-CM | POA: Diagnosis not present

## 2020-08-19 DIAGNOSIS — E538 Deficiency of other specified B group vitamins: Secondary | ICD-10-CM | POA: Diagnosis not present

## 2020-08-19 DIAGNOSIS — D649 Anemia, unspecified: Secondary | ICD-10-CM | POA: Diagnosis not present

## 2020-08-19 DIAGNOSIS — M109 Gout, unspecified: Secondary | ICD-10-CM | POA: Diagnosis not present

## 2020-08-19 DIAGNOSIS — N200 Calculus of kidney: Secondary | ICD-10-CM | POA: Diagnosis not present

## 2020-08-19 DIAGNOSIS — D469 Myelodysplastic syndrome, unspecified: Secondary | ICD-10-CM | POA: Diagnosis not present

## 2020-08-19 DIAGNOSIS — I4891 Unspecified atrial fibrillation: Secondary | ICD-10-CM | POA: Diagnosis not present

## 2020-08-21 DIAGNOSIS — D469 Myelodysplastic syndrome, unspecified: Secondary | ICD-10-CM | POA: Diagnosis not present

## 2020-08-21 DIAGNOSIS — J449 Chronic obstructive pulmonary disease, unspecified: Secondary | ICD-10-CM | POA: Diagnosis not present

## 2020-08-21 DIAGNOSIS — E860 Dehydration: Secondary | ICD-10-CM | POA: Diagnosis not present

## 2020-08-21 DIAGNOSIS — R531 Weakness: Secondary | ICD-10-CM | POA: Diagnosis not present

## 2020-08-21 DIAGNOSIS — R55 Syncope and collapse: Secondary | ICD-10-CM | POA: Diagnosis not present

## 2020-08-21 DIAGNOSIS — I10 Essential (primary) hypertension: Secondary | ICD-10-CM | POA: Diagnosis not present

## 2020-08-21 DIAGNOSIS — M109 Gout, unspecified: Secondary | ICD-10-CM | POA: Diagnosis not present

## 2020-08-21 DIAGNOSIS — E876 Hypokalemia: Secondary | ICD-10-CM | POA: Diagnosis not present

## 2020-08-21 DIAGNOSIS — G4700 Insomnia, unspecified: Secondary | ICD-10-CM | POA: Diagnosis not present

## 2020-08-21 DIAGNOSIS — J439 Emphysema, unspecified: Secondary | ICD-10-CM | POA: Diagnosis not present

## 2020-08-21 DIAGNOSIS — N179 Acute kidney failure, unspecified: Secondary | ICD-10-CM | POA: Diagnosis not present

## 2020-08-21 DIAGNOSIS — N200 Calculus of kidney: Secondary | ICD-10-CM | POA: Diagnosis not present

## 2020-08-21 DIAGNOSIS — D649 Anemia, unspecified: Secondary | ICD-10-CM | POA: Diagnosis not present

## 2020-08-21 DIAGNOSIS — E538 Deficiency of other specified B group vitamins: Secondary | ICD-10-CM | POA: Diagnosis not present

## 2020-08-21 DIAGNOSIS — R42 Dizziness and giddiness: Secondary | ICD-10-CM | POA: Diagnosis not present

## 2020-08-21 DIAGNOSIS — I6782 Cerebral ischemia: Secondary | ICD-10-CM | POA: Diagnosis not present

## 2020-08-21 DIAGNOSIS — I4891 Unspecified atrial fibrillation: Secondary | ICD-10-CM | POA: Diagnosis not present

## 2020-08-21 DIAGNOSIS — J80 Acute respiratory distress syndrome: Secondary | ICD-10-CM | POA: Diagnosis not present

## 2020-08-22 DIAGNOSIS — I951 Orthostatic hypotension: Secondary | ICD-10-CM | POA: Diagnosis not present

## 2020-08-22 DIAGNOSIS — N179 Acute kidney failure, unspecified: Secondary | ICD-10-CM | POA: Diagnosis not present

## 2020-08-22 DIAGNOSIS — E440 Moderate protein-calorie malnutrition: Secondary | ICD-10-CM | POA: Diagnosis not present

## 2020-08-23 DIAGNOSIS — E440 Moderate protein-calorie malnutrition: Secondary | ICD-10-CM | POA: Diagnosis not present

## 2020-08-23 DIAGNOSIS — N179 Acute kidney failure, unspecified: Secondary | ICD-10-CM | POA: Diagnosis not present

## 2020-08-23 DIAGNOSIS — I951 Orthostatic hypotension: Secondary | ICD-10-CM | POA: Diagnosis not present

## 2020-08-26 DIAGNOSIS — J449 Chronic obstructive pulmonary disease, unspecified: Secondary | ICD-10-CM | POA: Diagnosis not present

## 2020-08-26 DIAGNOSIS — D469 Myelodysplastic syndrome, unspecified: Secondary | ICD-10-CM | POA: Diagnosis not present

## 2020-08-26 DIAGNOSIS — D649 Anemia, unspecified: Secondary | ICD-10-CM | POA: Diagnosis not present

## 2020-08-26 DIAGNOSIS — E538 Deficiency of other specified B group vitamins: Secondary | ICD-10-CM | POA: Diagnosis not present

## 2020-08-26 DIAGNOSIS — I4891 Unspecified atrial fibrillation: Secondary | ICD-10-CM | POA: Diagnosis not present

## 2020-08-26 DIAGNOSIS — N200 Calculus of kidney: Secondary | ICD-10-CM | POA: Diagnosis not present

## 2020-08-26 DIAGNOSIS — M109 Gout, unspecified: Secondary | ICD-10-CM | POA: Diagnosis not present

## 2020-08-26 DIAGNOSIS — G4700 Insomnia, unspecified: Secondary | ICD-10-CM | POA: Diagnosis not present

## 2020-08-26 DIAGNOSIS — I10 Essential (primary) hypertension: Secondary | ICD-10-CM | POA: Diagnosis not present

## 2020-08-29 ENCOUNTER — Encounter: Payer: Self-pay | Admitting: Oncology

## 2020-08-29 DIAGNOSIS — I4891 Unspecified atrial fibrillation: Secondary | ICD-10-CM | POA: Diagnosis not present

## 2020-08-29 DIAGNOSIS — E860 Dehydration: Secondary | ICD-10-CM | POA: Diagnosis not present

## 2020-08-29 DIAGNOSIS — J449 Chronic obstructive pulmonary disease, unspecified: Secondary | ICD-10-CM | POA: Diagnosis not present

## 2020-08-29 DIAGNOSIS — G4700 Insomnia, unspecified: Secondary | ICD-10-CM | POA: Diagnosis not present

## 2020-08-29 DIAGNOSIS — D461 Refractory anemia with ring sideroblasts: Secondary | ICD-10-CM | POA: Diagnosis not present

## 2020-08-29 DIAGNOSIS — D469 Myelodysplastic syndrome, unspecified: Secondary | ICD-10-CM | POA: Diagnosis not present

## 2020-08-29 DIAGNOSIS — M1991 Primary osteoarthritis, unspecified site: Secondary | ICD-10-CM | POA: Diagnosis not present

## 2020-08-29 DIAGNOSIS — L4050 Arthropathic psoriasis, unspecified: Secondary | ICD-10-CM | POA: Diagnosis not present

## 2020-08-29 DIAGNOSIS — N200 Calculus of kidney: Secondary | ICD-10-CM | POA: Diagnosis not present

## 2020-08-29 DIAGNOSIS — M109 Gout, unspecified: Secondary | ICD-10-CM | POA: Diagnosis not present

## 2020-09-02 DIAGNOSIS — G4700 Insomnia, unspecified: Secondary | ICD-10-CM | POA: Diagnosis not present

## 2020-09-02 DIAGNOSIS — I4891 Unspecified atrial fibrillation: Secondary | ICD-10-CM | POA: Diagnosis not present

## 2020-09-02 DIAGNOSIS — M1991 Primary osteoarthritis, unspecified site: Secondary | ICD-10-CM | POA: Diagnosis not present

## 2020-09-02 DIAGNOSIS — D469 Myelodysplastic syndrome, unspecified: Secondary | ICD-10-CM | POA: Diagnosis not present

## 2020-09-02 DIAGNOSIS — E860 Dehydration: Secondary | ICD-10-CM | POA: Diagnosis not present

## 2020-09-02 DIAGNOSIS — N200 Calculus of kidney: Secondary | ICD-10-CM | POA: Diagnosis not present

## 2020-09-02 DIAGNOSIS — M109 Gout, unspecified: Secondary | ICD-10-CM | POA: Diagnosis not present

## 2020-09-02 DIAGNOSIS — J449 Chronic obstructive pulmonary disease, unspecified: Secondary | ICD-10-CM | POA: Diagnosis not present

## 2020-09-02 DIAGNOSIS — L4050 Arthropathic psoriasis, unspecified: Secondary | ICD-10-CM | POA: Diagnosis not present

## 2020-09-07 ENCOUNTER — Encounter: Payer: Self-pay | Admitting: Pharmacist

## 2020-09-07 DIAGNOSIS — D461 Refractory anemia with ring sideroblasts: Secondary | ICD-10-CM

## 2020-09-07 HISTORY — DX: Refractory anemia with ring sideroblasts: D46.1

## 2020-09-10 DIAGNOSIS — D469 Myelodysplastic syndrome, unspecified: Secondary | ICD-10-CM | POA: Diagnosis not present

## 2020-09-10 DIAGNOSIS — M1991 Primary osteoarthritis, unspecified site: Secondary | ICD-10-CM | POA: Diagnosis not present

## 2020-09-10 DIAGNOSIS — M109 Gout, unspecified: Secondary | ICD-10-CM | POA: Diagnosis not present

## 2020-09-10 DIAGNOSIS — I4891 Unspecified atrial fibrillation: Secondary | ICD-10-CM | POA: Diagnosis not present

## 2020-09-10 DIAGNOSIS — J449 Chronic obstructive pulmonary disease, unspecified: Secondary | ICD-10-CM | POA: Diagnosis not present

## 2020-09-10 DIAGNOSIS — E860 Dehydration: Secondary | ICD-10-CM | POA: Diagnosis not present

## 2020-09-10 DIAGNOSIS — N200 Calculus of kidney: Secondary | ICD-10-CM | POA: Diagnosis not present

## 2020-09-10 DIAGNOSIS — G4700 Insomnia, unspecified: Secondary | ICD-10-CM | POA: Diagnosis not present

## 2020-09-10 DIAGNOSIS — L4050 Arthropathic psoriasis, unspecified: Secondary | ICD-10-CM | POA: Diagnosis not present

## 2020-09-15 DIAGNOSIS — M109 Gout, unspecified: Secondary | ICD-10-CM | POA: Diagnosis not present

## 2020-09-15 DIAGNOSIS — I708 Atherosclerosis of other arteries: Secondary | ICD-10-CM | POA: Diagnosis not present

## 2020-09-15 DIAGNOSIS — I50810 Right heart failure, unspecified: Secondary | ICD-10-CM | POA: Diagnosis not present

## 2020-09-15 DIAGNOSIS — J449 Chronic obstructive pulmonary disease, unspecified: Secondary | ICD-10-CM | POA: Diagnosis not present

## 2020-09-15 DIAGNOSIS — Z743 Need for continuous supervision: Secondary | ICD-10-CM | POA: Diagnosis not present

## 2020-09-15 DIAGNOSIS — I10 Essential (primary) hypertension: Secondary | ICD-10-CM | POA: Diagnosis not present

## 2020-09-15 DIAGNOSIS — I700 Atherosclerosis of aorta: Secondary | ICD-10-CM | POA: Diagnosis not present

## 2020-09-15 DIAGNOSIS — R531 Weakness: Secondary | ICD-10-CM | POA: Diagnosis not present

## 2020-09-15 DIAGNOSIS — L03116 Cellulitis of left lower limb: Secondary | ICD-10-CM | POA: Diagnosis not present

## 2020-09-15 DIAGNOSIS — C946 Myelodysplastic disease, not classified: Secondary | ICD-10-CM | POA: Diagnosis not present

## 2020-09-15 DIAGNOSIS — E785 Hyperlipidemia, unspecified: Secondary | ICD-10-CM | POA: Diagnosis not present

## 2020-09-15 DIAGNOSIS — E860 Dehydration: Secondary | ICD-10-CM | POA: Diagnosis not present

## 2020-09-15 DIAGNOSIS — I2510 Atherosclerotic heart disease of native coronary artery without angina pectoris: Secondary | ICD-10-CM | POA: Diagnosis not present

## 2020-09-15 DIAGNOSIS — J441 Chronic obstructive pulmonary disease with (acute) exacerbation: Secondary | ICD-10-CM | POA: Diagnosis not present

## 2020-09-15 DIAGNOSIS — L409 Psoriasis, unspecified: Secondary | ICD-10-CM | POA: Diagnosis not present

## 2020-09-15 DIAGNOSIS — E538 Deficiency of other specified B group vitamins: Secondary | ICD-10-CM | POA: Diagnosis not present

## 2020-09-15 DIAGNOSIS — L4050 Arthropathic psoriasis, unspecified: Secondary | ICD-10-CM | POA: Diagnosis not present

## 2020-09-15 DIAGNOSIS — F17210 Nicotine dependence, cigarettes, uncomplicated: Secondary | ICD-10-CM | POA: Diagnosis not present

## 2020-09-18 DIAGNOSIS — M109 Gout, unspecified: Secondary | ICD-10-CM | POA: Diagnosis not present

## 2020-09-18 DIAGNOSIS — I4891 Unspecified atrial fibrillation: Secondary | ICD-10-CM | POA: Diagnosis not present

## 2020-09-18 DIAGNOSIS — D469 Myelodysplastic syndrome, unspecified: Secondary | ICD-10-CM | POA: Diagnosis not present

## 2020-09-18 DIAGNOSIS — G4700 Insomnia, unspecified: Secondary | ICD-10-CM | POA: Diagnosis not present

## 2020-09-18 DIAGNOSIS — N200 Calculus of kidney: Secondary | ICD-10-CM | POA: Diagnosis not present

## 2020-09-18 DIAGNOSIS — M1991 Primary osteoarthritis, unspecified site: Secondary | ICD-10-CM | POA: Diagnosis not present

## 2020-09-18 DIAGNOSIS — E860 Dehydration: Secondary | ICD-10-CM | POA: Diagnosis not present

## 2020-09-18 DIAGNOSIS — J449 Chronic obstructive pulmonary disease, unspecified: Secondary | ICD-10-CM | POA: Diagnosis not present

## 2020-09-18 DIAGNOSIS — L4050 Arthropathic psoriasis, unspecified: Secondary | ICD-10-CM | POA: Diagnosis not present

## 2020-09-19 ENCOUNTER — Other Ambulatory Visit: Payer: Self-pay | Admitting: Hematology and Oncology

## 2020-09-19 ENCOUNTER — Inpatient Hospital Stay: Payer: Medicare PPO

## 2020-09-19 DIAGNOSIS — D461 Refractory anemia with ring sideroblasts: Secondary | ICD-10-CM | POA: Diagnosis not present

## 2020-09-19 LAB — CBC: RBC: 2.99 — AB (ref 3.87–5.11)

## 2020-09-19 LAB — CBC AND DIFFERENTIAL
HCT: 30 — AB (ref 36–46)
Hemoglobin: 9.5 — AB (ref 12.0–16.0)
Platelets: 289 (ref 150–399)
WBC: 7.3

## 2020-09-22 ENCOUNTER — Telehealth: Payer: Self-pay | Admitting: Oncology

## 2020-09-22 ENCOUNTER — Other Ambulatory Visit: Payer: Self-pay

## 2020-09-22 ENCOUNTER — Inpatient Hospital Stay: Payer: Medicare PPO | Attending: Oncology

## 2020-09-22 DIAGNOSIS — D461 Refractory anemia with ring sideroblasts: Secondary | ICD-10-CM | POA: Insufficient documentation

## 2020-09-22 MED ORDER — LUSPATERCEPT-AAMT 75 MG ~~LOC~~ SOLR
1.9000 mg/kg | Freq: Once | SUBCUTANEOUS | Status: AC
  Administered 2020-09-22: 100 mg via SUBCUTANEOUS
  Filled 2020-09-22 (×2): qty 2

## 2020-09-22 NOTE — Progress Notes (Signed)
Pt stable at time of discharge. 

## 2020-09-22 NOTE — Telephone Encounter (Signed)
Scheduled appts per treatment plan. Gave pt an appt calendar.  

## 2020-09-22 NOTE — Patient Instructions (Signed)
Luspatercept injection What is this medicine? LUSPATERCEPT (lus PAT er sept) helps your body make more red blood cells. This medicine is used to treat anemia caused by beta thalassemia or myelodysplastic syndromes. This medicine may be used for other purposes; ask your health care provider or pharmacist if you have questions. COMMON BRAND NAME(S): REBLOZYL What should I tell my health care provider before I take this medicine? They need to know if you have any of these conditions:  cigarette smoker  have had your spleen removed  high blood pressure  history of blood clots  an unusual or allergic reaction to luspatercept, other medicines, foods, dyes or preservatives  pregnant or trying to get pregnant  breast-feeding How should I use this medicine? This medicine is for injection under the skin. It is given by a healthcare professional in a hospital or clinic setting. Talk to your pediatrician about the use of the medicine in children. This medicine is not approved for use in children. Overdosage: If you think you have taken too much of this medicine contact a poison control center or emergency room at once. NOTE: This medicine is only for you. Do not share this medicine with others. What if I miss a dose? Keep appointments for follow-up doses. It is important not to miss your dose. Call your doctor or healthcare professional if you are unable to keep an appointment. What may interact with this medicine? Interactions are not expected. This list may not describe all possible interactions. Give your health care provider a list of all the medicines, herbs, non-prescription drugs, or dietary supplements you use. Also tell them if you smoke, drink alcohol, or use illegal drugs. Some items may interact with your medicine. What should I watch for while using this medicine? Your condition will be monitored carefully while you are receiving this medicine. Do not become pregnant while taking  this medicine or for 3 months after stopping it. Women should inform their healthcare professional if they wish to become pregnant or think they might be pregnant. There is a potential for serious side effects and harm to an unborn child. Talk to your healthcare professional for more information. Do not breast-feed an infant while taking this medicine. You may need blood work done while you are taking this medicine. What side effects may I notice from receiving this medicine? Side effects that you should report to your doctor or health care professional as soon as possible:  allergic reactions like skin rash, itching or hives; swelling of the face, lips, or tongue  signs and symptoms of a blood clot such as chest pain; shortness of breath; pain, swelling, or warmth in the leg  signs and symptoms of a stroke like changes in vision; confusion; trouble speaking or understanding; severe headaches; sudden numbness or weakness of the face, arm or leg; trouble walking; dizziness; loss of balance or coordination Side effects that usually do not require medical attention (report these to your doctor or health care professional if they continue or are bothersome):  cough  diarrhea  dizziness  headache  joint pain  stomach pain  tiredness This list may not describe all possible side effects. Call your doctor for medical advice about side effects. You may report side effects to FDA at 1-800-FDA-1088. Where should I keep my medicine? This medicine is given in a hospital or clinic and will not be stored at home. NOTE: This sheet is a summary. It may not cover all possible information. If you have questions about   this medicine, talk to your doctor, pharmacist, or health care provider.  2020 Elsevier/Gold Standard (2019-03-20 15:57:59)  

## 2020-09-23 DIAGNOSIS — J449 Chronic obstructive pulmonary disease, unspecified: Secondary | ICD-10-CM | POA: Diagnosis not present

## 2020-09-23 DIAGNOSIS — M1991 Primary osteoarthritis, unspecified site: Secondary | ICD-10-CM | POA: Diagnosis not present

## 2020-09-23 DIAGNOSIS — N200 Calculus of kidney: Secondary | ICD-10-CM | POA: Diagnosis not present

## 2020-09-23 DIAGNOSIS — D469 Myelodysplastic syndrome, unspecified: Secondary | ICD-10-CM | POA: Diagnosis not present

## 2020-09-23 DIAGNOSIS — M109 Gout, unspecified: Secondary | ICD-10-CM | POA: Diagnosis not present

## 2020-09-23 DIAGNOSIS — I4891 Unspecified atrial fibrillation: Secondary | ICD-10-CM | POA: Diagnosis not present

## 2020-09-23 DIAGNOSIS — E860 Dehydration: Secondary | ICD-10-CM | POA: Diagnosis not present

## 2020-09-23 DIAGNOSIS — G4700 Insomnia, unspecified: Secondary | ICD-10-CM | POA: Diagnosis not present

## 2020-09-23 DIAGNOSIS — L4050 Arthropathic psoriasis, unspecified: Secondary | ICD-10-CM | POA: Diagnosis not present

## 2020-10-08 ENCOUNTER — Other Ambulatory Visit: Payer: Self-pay | Admitting: Hematology and Oncology

## 2020-10-08 DIAGNOSIS — C946 Myelodysplastic disease, not classified: Secondary | ICD-10-CM | POA: Diagnosis not present

## 2020-10-08 DIAGNOSIS — D461 Refractory anemia with ring sideroblasts: Secondary | ICD-10-CM | POA: Diagnosis not present

## 2020-10-08 LAB — BASIC METABOLIC PANEL
BUN: 20 (ref 4–21)
CO2: 24 — AB (ref 13–22)
Chloride: 111 — AB (ref 99–108)
Creatinine: 1.3 — AB (ref 0.5–1.1)
Glucose: 112
Potassium: 4.2 (ref 3.4–5.3)
Sodium: 141 (ref 137–147)

## 2020-10-08 LAB — HEPATIC FUNCTION PANEL
ALT: 17 (ref 7–35)
AST: 44 — AB (ref 13–35)
Alkaline Phosphatase: 70 (ref 25–125)
Bilirubin, Total: 0.6

## 2020-10-08 LAB — CBC AND DIFFERENTIAL
HCT: 32 — AB (ref 36–46)
Hemoglobin: 10 — AB (ref 12.0–16.0)
Neutrophils Absolute: 4.33
Platelets: 255 (ref 150–399)
WBC: 7.1

## 2020-10-08 LAB — CBC: RBC: 3.1 — AB (ref 3.87–5.11)

## 2020-10-08 LAB — COMPREHENSIVE METABOLIC PANEL
Albumin: 3.8 (ref 3.5–5.0)
Calcium: 9.4 (ref 8.7–10.7)

## 2020-10-10 NOTE — Progress Notes (Unsigned)
PT STABLE AT TIME OF DISCHARGE 

## 2020-10-10 NOTE — Progress Notes (Signed)
PT STABLE AT TIME OF DISCHARGE 

## 2020-10-13 ENCOUNTER — Inpatient Hospital Stay: Payer: Medicare PPO | Attending: Oncology

## 2020-10-13 ENCOUNTER — Other Ambulatory Visit: Payer: Self-pay

## 2020-10-13 VITALS — BP 131/59 | HR 65 | Temp 98.0°F | Resp 16 | Ht 64.0 in | Wt 121.2 lb

## 2020-10-13 DIAGNOSIS — D461 Refractory anemia with ring sideroblasts: Secondary | ICD-10-CM | POA: Diagnosis not present

## 2020-10-13 MED ORDER — LUSPATERCEPT-AAMT 75 MG ~~LOC~~ SOLR
1.9000 mg/kg | Freq: Once | SUBCUTANEOUS | Status: AC
  Administered 2020-10-13: 100 mg via SUBCUTANEOUS
  Filled 2020-10-13: qty 1.5

## 2020-10-13 NOTE — Patient Instructions (Signed)
Luspatercept injection What is this medicine? LUSPATERCEPT (lus PAT er sept) helps your body make more red blood cells. This medicine is used to treat anemia caused by beta thalassemia or myelodysplastic syndromes. This medicine may be used for other purposes; ask your health care provider or pharmacist if you have questions. COMMON BRAND NAME(S): REBLOZYL What should I tell my health care provider before I take this medicine? They need to know if you have any of these conditions:  cigarette smoker  have had your spleen removed  high blood pressure  history of blood clots  an unusual or allergic reaction to luspatercept, other medicines, foods, dyes or preservatives  pregnant or trying to get pregnant  breast-feeding How should I use this medicine? This medicine is for injection under the skin. It is given by a healthcare professional in a hospital or clinic setting. Talk to your pediatrician about the use of the medicine in children. This medicine is not approved for use in children. Overdosage: If you think you have taken too much of this medicine contact a poison control center or emergency room at once. NOTE: This medicine is only for you. Do not share this medicine with others. What if I miss a dose? Keep appointments for follow-up doses. It is important not to miss your dose. Call your doctor or healthcare professional if you are unable to keep an appointment. What may interact with this medicine? Interactions are not expected. This list may not describe all possible interactions. Give your health care provider a list of all the medicines, herbs, non-prescription drugs, or dietary supplements you use. Also tell them if you smoke, drink alcohol, or use illegal drugs. Some items may interact with your medicine. What should I watch for while using this medicine? Your condition will be monitored carefully while you are receiving this medicine. Do not become pregnant while taking  this medicine or for 3 months after stopping it. Women should inform their healthcare professional if they wish to become pregnant or think they might be pregnant. There is a potential for serious side effects and harm to an unborn child. Talk to your healthcare professional for more information. Do not breast-feed an infant while taking this medicine. You may need blood work done while you are taking this medicine. What side effects may I notice from receiving this medicine? Side effects that you should report to your doctor or health care professional as soon as possible:  allergic reactions like skin rash, itching or hives; swelling of the face, lips, or tongue  signs and symptoms of a blood clot such as chest pain; shortness of breath; pain, swelling, or warmth in the leg  signs and symptoms of a stroke like changes in vision; confusion; trouble speaking or understanding; severe headaches; sudden numbness or weakness of the face, arm or leg; trouble walking; dizziness; loss of balance or coordination Side effects that usually do not require medical attention (report these to your doctor or health care professional if they continue or are bothersome):  cough  diarrhea  dizziness  headache  joint pain  stomach pain  tiredness This list may not describe all possible side effects. Call your doctor for medical advice about side effects. You may report side effects to FDA at 1-800-FDA-1088. Where should I keep my medicine? This medicine is given in a hospital or clinic and will not be stored at home. NOTE: This sheet is a summary. It may not cover all possible information. If you have questions about   this medicine, talk to your doctor, pharmacist, or health care provider.  2020 Elsevier/Gold Standard (2019-03-20 15:57:59)  

## 2020-10-13 NOTE — Progress Notes (Signed)
Pt stable at time of discharge. 

## 2020-10-16 DIAGNOSIS — E118 Type 2 diabetes mellitus with unspecified complications: Secondary | ICD-10-CM | POA: Diagnosis not present

## 2020-10-16 DIAGNOSIS — C946 Myelodysplastic disease, not classified: Secondary | ICD-10-CM | POA: Diagnosis not present

## 2020-10-16 DIAGNOSIS — M109 Gout, unspecified: Secondary | ICD-10-CM | POA: Diagnosis not present

## 2020-10-16 DIAGNOSIS — L4050 Arthropathic psoriasis, unspecified: Secondary | ICD-10-CM | POA: Diagnosis not present

## 2020-10-16 DIAGNOSIS — E785 Hyperlipidemia, unspecified: Secondary | ICD-10-CM | POA: Diagnosis not present

## 2020-10-16 DIAGNOSIS — E538 Deficiency of other specified B group vitamins: Secondary | ICD-10-CM | POA: Diagnosis not present

## 2020-10-16 DIAGNOSIS — N183 Chronic kidney disease, stage 3 unspecified: Secondary | ICD-10-CM | POA: Diagnosis not present

## 2020-10-16 DIAGNOSIS — J449 Chronic obstructive pulmonary disease, unspecified: Secondary | ICD-10-CM | POA: Diagnosis not present

## 2020-10-16 DIAGNOSIS — I10 Essential (primary) hypertension: Secondary | ICD-10-CM | POA: Diagnosis not present

## 2020-10-21 ENCOUNTER — Telehealth: Payer: Self-pay | Admitting: Oncology

## 2020-10-21 NOTE — Telephone Encounter (Signed)
Patient called 11/9 concerning Lab Appt's for Injections scheduled for 11/22, 12/13.  Scheduled Lab Appts for 11/18, 12/11

## 2020-10-24 NOTE — Progress Notes (Signed)
Patient brought me in a statement from [employer], they denied her ambulance ride. Patient has [facility] Medicare now, I sent this information into the EMS Billing, told her once she received a bill with the ambulance run number I would resend.

## 2020-10-30 ENCOUNTER — Other Ambulatory Visit: Payer: Self-pay | Admitting: Oncology

## 2020-10-30 ENCOUNTER — Other Ambulatory Visit: Payer: Self-pay | Admitting: Hematology and Oncology

## 2020-10-30 ENCOUNTER — Inpatient Hospital Stay: Payer: Medicare PPO

## 2020-10-30 DIAGNOSIS — Z0001 Encounter for general adult medical examination with abnormal findings: Secondary | ICD-10-CM | POA: Diagnosis not present

## 2020-10-30 DIAGNOSIS — C946 Myelodysplastic disease, not classified: Secondary | ICD-10-CM | POA: Diagnosis not present

## 2020-10-30 LAB — BASIC METABOLIC PANEL
BUN: 15 (ref 4–21)
CO2: 22 (ref 13–22)
Chloride: 115 — AB (ref 99–108)
Creatinine: 1.2 — AB (ref 0.5–1.1)
Glucose: 103
Potassium: 3.6 (ref 3.4–5.3)
Sodium: 143 (ref 137–147)

## 2020-10-30 LAB — CBC AND DIFFERENTIAL
HCT: 33 — AB (ref 36–46)
Hemoglobin: 10.6 — AB (ref 12.0–16.0)
Neutrophils Absolute: 4.28
Platelets: 248 (ref 150–399)
WBC: 6.8

## 2020-10-30 LAB — COMPREHENSIVE METABOLIC PANEL
Albumin: 3.9 (ref 3.5–5.0)
Calcium: 9.3 (ref 8.7–10.7)

## 2020-10-30 LAB — HEPATIC FUNCTION PANEL
ALT: 18 (ref 7–35)
AST: 41 — AB (ref 13–35)
Alkaline Phosphatase: 56 (ref 25–125)
Bilirubin, Total: 0.6

## 2020-10-30 LAB — CBC: RBC: 3.17 — AB (ref 3.87–5.11)

## 2020-11-03 ENCOUNTER — Other Ambulatory Visit: Payer: Self-pay

## 2020-11-03 ENCOUNTER — Inpatient Hospital Stay: Payer: Medicare PPO

## 2020-11-03 VITALS — BP 158/68 | HR 57 | Temp 97.8°F | Resp 18 | Ht 64.0 in | Wt 121.2 lb

## 2020-11-03 DIAGNOSIS — D461 Refractory anemia with ring sideroblasts: Secondary | ICD-10-CM

## 2020-11-03 MED ORDER — LUSPATERCEPT-AAMT 75 MG ~~LOC~~ SOLR
1.9000 mg/kg | Freq: Once | SUBCUTANEOUS | Status: AC
  Administered 2020-11-03: 100 mg via SUBCUTANEOUS
  Filled 2020-11-03: qty 1.5

## 2020-11-03 NOTE — Progress Notes (Signed)
PT STABLE AT TIME OF DISCHARGE 

## 2020-11-03 NOTE — Patient Instructions (Signed)
Luspatercept injection What is this medicine? LUSPATERCEPT (lus PAT er sept) helps your body make more red blood cells. This medicine is used to treat anemia caused by beta thalassemia or myelodysplastic syndromes. This medicine may be used for other purposes; ask your health care provider or pharmacist if you have questions. COMMON BRAND NAME(S): REBLOZYL What should I tell my health care provider before I take this medicine? They need to know if you have any of these conditions:  cigarette smoker  have had your spleen removed  high blood pressure  history of blood clots  an unusual or allergic reaction to luspatercept, other medicines, foods, dyes or preservatives  pregnant or trying to get pregnant  breast-feeding How should I use this medicine? This medicine is for injection under the skin. It is given by a healthcare professional in a hospital or clinic setting. Talk to your pediatrician about the use of the medicine in children. This medicine is not approved for use in children. Overdosage: If you think you have taken too much of this medicine contact a poison control center or emergency room at once. NOTE: This medicine is only for you. Do not share this medicine with others. What if I miss a dose? Keep appointments for follow-up doses. It is important not to miss your dose. Call your doctor or healthcare professional if you are unable to keep an appointment. What may interact with this medicine? Interactions are not expected. This list may not describe all possible interactions. Give your health care provider a list of all the medicines, herbs, non-prescription drugs, or dietary supplements you use. Also tell them if you smoke, drink alcohol, or use illegal drugs. Some items may interact with your medicine. What should I watch for while using this medicine? Your condition will be monitored carefully while you are receiving this medicine. Do not become pregnant while taking  this medicine or for 3 months after stopping it. Women should inform their healthcare professional if they wish to become pregnant or think they might be pregnant. There is a potential for serious side effects and harm to an unborn child. Talk to your healthcare professional for more information. Do not breast-feed an infant while taking this medicine. You may need blood work done while you are taking this medicine. What side effects may I notice from receiving this medicine? Side effects that you should report to your doctor or health care professional as soon as possible:  allergic reactions like skin rash, itching or hives; swelling of the face, lips, or tongue  signs and symptoms of a blood clot such as chest pain; shortness of breath; pain, swelling, or warmth in the leg  signs and symptoms of a stroke like changes in vision; confusion; trouble speaking or understanding; severe headaches; sudden numbness or weakness of the face, arm or leg; trouble walking; dizziness; loss of balance or coordination Side effects that usually do not require medical attention (report these to your doctor or health care professional if they continue or are bothersome):  cough  diarrhea  dizziness  headache  joint pain  stomach pain  tiredness This list may not describe all possible side effects. Call your doctor for medical advice about side effects. You may report side effects to FDA at 1-800-FDA-1088. Where should I keep my medicine? This medicine is given in a hospital or clinic and will not be stored at home. NOTE: This sheet is a summary. It may not cover all possible information. If you have questions about   this medicine, talk to your doctor, pharmacist, or health care provider.  2020 Elsevier/Gold Standard (2019-03-20 15:57:59)  

## 2020-11-05 DIAGNOSIS — H353132 Nonexudative age-related macular degeneration, bilateral, intermediate dry stage: Secondary | ICD-10-CM | POA: Diagnosis not present

## 2020-11-05 DIAGNOSIS — H1013 Acute atopic conjunctivitis, bilateral: Secondary | ICD-10-CM | POA: Diagnosis not present

## 2020-11-14 DIAGNOSIS — E785 Hyperlipidemia, unspecified: Secondary | ICD-10-CM | POA: Diagnosis not present

## 2020-11-14 DIAGNOSIS — I10 Essential (primary) hypertension: Secondary | ICD-10-CM | POA: Diagnosis not present

## 2020-11-14 DIAGNOSIS — I50810 Right heart failure, unspecified: Secondary | ICD-10-CM | POA: Diagnosis not present

## 2020-11-14 DIAGNOSIS — C946 Myelodysplastic disease, not classified: Secondary | ICD-10-CM | POA: Diagnosis not present

## 2020-11-14 DIAGNOSIS — N183 Chronic kidney disease, stage 3 unspecified: Secondary | ICD-10-CM | POA: Diagnosis not present

## 2020-11-14 DIAGNOSIS — M109 Gout, unspecified: Secondary | ICD-10-CM | POA: Diagnosis not present

## 2020-11-14 DIAGNOSIS — L4050 Arthropathic psoriasis, unspecified: Secondary | ICD-10-CM | POA: Diagnosis not present

## 2020-11-14 DIAGNOSIS — E538 Deficiency of other specified B group vitamins: Secondary | ICD-10-CM | POA: Diagnosis not present

## 2020-11-14 DIAGNOSIS — J449 Chronic obstructive pulmonary disease, unspecified: Secondary | ICD-10-CM | POA: Diagnosis not present

## 2020-11-14 NOTE — Progress Notes (Signed)
PT STABLE AT TIME OF DISCHARGE 

## 2020-11-20 ENCOUNTER — Other Ambulatory Visit: Payer: Self-pay | Admitting: Hematology and Oncology

## 2020-11-20 ENCOUNTER — Inpatient Hospital Stay: Payer: Medicare PPO | Attending: Oncology

## 2020-11-20 DIAGNOSIS — D461 Refractory anemia with ring sideroblasts: Secondary | ICD-10-CM | POA: Insufficient documentation

## 2020-11-20 DIAGNOSIS — D649 Anemia, unspecified: Secondary | ICD-10-CM | POA: Diagnosis not present

## 2020-11-20 DIAGNOSIS — Z0001 Encounter for general adult medical examination with abnormal findings: Secondary | ICD-10-CM | POA: Diagnosis not present

## 2020-11-20 LAB — CBC AND DIFFERENTIAL
HCT: 33 — AB (ref 36–46)
Hemoglobin: 10.4 — AB (ref 12.0–16.0)
Neutrophils Absolute: 3.48
Platelets: 277 (ref 150–399)
WBC: 6.1

## 2020-11-20 LAB — CBC
MCV: 103 — AB (ref 81–99)
RBC: 3.2 — AB (ref 3.87–5.11)

## 2020-11-20 LAB — HEPATIC FUNCTION PANEL
ALT: 20 (ref 7–35)
AST: 41 — AB (ref 13–35)
Alkaline Phosphatase: 59 (ref 25–125)
Bilirubin, Total: 0.7

## 2020-11-20 LAB — BASIC METABOLIC PANEL
BUN: 20 (ref 4–21)
CO2: 24 — AB (ref 13–22)
Chloride: 109 — AB (ref 99–108)
Creatinine: 1.3 — AB (ref 0.5–1.1)
Glucose: 131
Potassium: 4.1 (ref 3.4–5.3)
Sodium: 143 (ref 137–147)

## 2020-11-20 LAB — COMPREHENSIVE METABOLIC PANEL
Albumin: 3.8 (ref 3.5–5.0)
Calcium: 9.4 (ref 8.7–10.7)

## 2020-11-24 ENCOUNTER — Inpatient Hospital Stay: Payer: Medicare PPO

## 2020-11-24 ENCOUNTER — Other Ambulatory Visit: Payer: Self-pay

## 2020-11-24 VITALS — BP 145/65 | HR 57 | Temp 97.6°F | Resp 16 | Ht 64.0 in | Wt 120.8 lb

## 2020-11-24 DIAGNOSIS — D461 Refractory anemia with ring sideroblasts: Secondary | ICD-10-CM

## 2020-11-24 MED ORDER — LUSPATERCEPT-AAMT 75 MG ~~LOC~~ SOLR
1.9000 mg/kg | Freq: Once | SUBCUTANEOUS | Status: AC
  Administered 2020-11-24: 100 mg via SUBCUTANEOUS
  Filled 2020-11-24: qty 1.5

## 2020-11-24 NOTE — Patient Instructions (Signed)
Luspatercept injection What is this medicine? LUSPATERCEPT (lus PAT er sept) helps your body make more red blood cells. This medicine is used to treat anemia caused by beta thalassemia or myelodysplastic syndromes. This medicine may be used for other purposes; ask your health care provider or pharmacist if you have questions. COMMON BRAND NAME(S): REBLOZYL What should I tell my health care provider before I take this medicine? They need to know if you have any of these conditions:  cigarette smoker  have had your spleen removed  high blood pressure  history of blood clots  an unusual or allergic reaction to luspatercept, other medicines, foods, dyes or preservatives  pregnant or trying to get pregnant  breast-feeding How should I use this medicine? This medicine is for injection under the skin. It is given by a healthcare professional in a hospital or clinic setting. Talk to your pediatrician about the use of the medicine in children. This medicine is not approved for use in children. Overdosage: If you think you have taken too much of this medicine contact a poison control center or emergency room at once. NOTE: This medicine is only for you. Do not share this medicine with others. What if I miss a dose? Keep appointments for follow-up doses. It is important not to miss your dose. Call your doctor or healthcare professional if you are unable to keep an appointment. What may interact with this medicine? Interactions are not expected. This list may not describe all possible interactions. Give your health care provider a list of all the medicines, herbs, non-prescription drugs, or dietary supplements you use. Also tell them if you smoke, drink alcohol, or use illegal drugs. Some items may interact with your medicine. What should I watch for while using this medicine? Your condition will be monitored carefully while you are receiving this medicine. Do not become pregnant while taking  this medicine or for 3 months after stopping it. Women should inform their healthcare professional if they wish to become pregnant or think they might be pregnant. There is a potential for serious side effects and harm to an unborn child. Talk to your healthcare professional for more information. Do not breast-feed an infant while taking this medicine. You may need blood work done while you are taking this medicine. What side effects may I notice from receiving this medicine? Side effects that you should report to your doctor or health care professional as soon as possible:  allergic reactions like skin rash, itching or hives; swelling of the face, lips, or tongue  signs and symptoms of a blood clot such as chest pain; shortness of breath; pain, swelling, or warmth in the leg  signs and symptoms of a stroke like changes in vision; confusion; trouble speaking or understanding; severe headaches; sudden numbness or weakness of the face, arm or leg; trouble walking; dizziness; loss of balance or coordination Side effects that usually do not require medical attention (report these to your doctor or health care professional if they continue or are bothersome):  cough  diarrhea  dizziness  headache  joint pain  stomach pain  tiredness This list may not describe all possible side effects. Call your doctor for medical advice about side effects. You may report side effects to FDA at 1-800-FDA-1088. Where should I keep my medicine? This medicine is given in a hospital or clinic and will not be stored at home. NOTE: This sheet is a summary. It may not cover all possible information. If you have questions about   this medicine, talk to your doctor, pharmacist, or health care provider.  2020 Elsevier/Gold Standard (2019-03-20 15:57:59)  

## 2020-11-24 NOTE — Progress Notes (Signed)
Pt stable at time of discharge (0932).

## 2020-12-12 DIAGNOSIS — R0602 Shortness of breath: Secondary | ICD-10-CM | POA: Diagnosis not present

## 2020-12-12 DIAGNOSIS — R0600 Dyspnea, unspecified: Secondary | ICD-10-CM | POA: Diagnosis not present

## 2020-12-15 DIAGNOSIS — J449 Chronic obstructive pulmonary disease, unspecified: Secondary | ICD-10-CM | POA: Diagnosis not present

## 2020-12-15 DIAGNOSIS — C946 Myelodysplastic disease, not classified: Secondary | ICD-10-CM | POA: Diagnosis not present

## 2020-12-15 DIAGNOSIS — E118 Type 2 diabetes mellitus with unspecified complications: Secondary | ICD-10-CM | POA: Diagnosis not present

## 2020-12-15 DIAGNOSIS — I10 Essential (primary) hypertension: Secondary | ICD-10-CM | POA: Diagnosis not present

## 2020-12-15 DIAGNOSIS — L4050 Arthropathic psoriasis, unspecified: Secondary | ICD-10-CM | POA: Diagnosis not present

## 2020-12-15 DIAGNOSIS — N183 Chronic kidney disease, stage 3 unspecified: Secondary | ICD-10-CM | POA: Diagnosis not present

## 2020-12-15 DIAGNOSIS — E538 Deficiency of other specified B group vitamins: Secondary | ICD-10-CM | POA: Diagnosis not present

## 2020-12-15 DIAGNOSIS — E785 Hyperlipidemia, unspecified: Secondary | ICD-10-CM | POA: Diagnosis not present

## 2020-12-15 DIAGNOSIS — M109 Gout, unspecified: Secondary | ICD-10-CM | POA: Diagnosis not present

## 2020-12-31 ENCOUNTER — Inpatient Hospital Stay: Payer: Medicare PPO | Admitting: Hematology and Oncology

## 2020-12-31 ENCOUNTER — Inpatient Hospital Stay: Payer: Medicare PPO

## 2020-12-31 ENCOUNTER — Other Ambulatory Visit: Payer: Self-pay | Admitting: Hematology and Oncology

## 2020-12-31 ENCOUNTER — Other Ambulatory Visit: Payer: Self-pay

## 2020-12-31 DIAGNOSIS — U071 COVID-19: Secondary | ICD-10-CM | POA: Diagnosis not present

## 2020-12-31 DIAGNOSIS — E876 Hypokalemia: Secondary | ICD-10-CM | POA: Diagnosis not present

## 2020-12-31 DIAGNOSIS — Z043 Encounter for examination and observation following other accident: Secondary | ICD-10-CM | POA: Diagnosis not present

## 2020-12-31 DIAGNOSIS — S01412A Laceration without foreign body of left cheek and temporomandibular area, initial encounter: Secondary | ICD-10-CM | POA: Diagnosis not present

## 2020-12-31 DIAGNOSIS — S0993XA Unspecified injury of face, initial encounter: Secondary | ICD-10-CM | POA: Diagnosis not present

## 2020-12-31 DIAGNOSIS — S0181XA Laceration without foreign body of other part of head, initial encounter: Secondary | ICD-10-CM | POA: Diagnosis not present

## 2020-12-31 DIAGNOSIS — R5383 Other fatigue: Secondary | ICD-10-CM | POA: Diagnosis not present

## 2020-12-31 DIAGNOSIS — N3000 Acute cystitis without hematuria: Secondary | ICD-10-CM | POA: Diagnosis not present

## 2020-12-31 DIAGNOSIS — R531 Weakness: Secondary | ICD-10-CM | POA: Diagnosis not present

## 2020-12-31 DIAGNOSIS — S0990XA Unspecified injury of head, initial encounter: Secondary | ICD-10-CM | POA: Diagnosis not present

## 2020-12-31 NOTE — Progress Notes (Signed)
Patient presented to clinic for scheduled appointment. She states she had fallen at home prior to coming in to this appointment. She has two bleeding lacerations and severe bruising to the left side of her face. I advised the patient that she would need to be evaluated and treated in the ED. Report was given to ED physician and Eustaquio Maize, RN. Patient was transported to ED via wheelchair.

## 2020-12-31 NOTE — Progress Notes (Deleted)
[facility]  [address] [place],  [place]  [address] [phone]  Clinic Day:  12/31/2020  Referring physician: Penelope Coop, FNP   CHIEF COMPLAINT:  CC: ***  Current Treatment:  ***   HISTORY OF PRESENT ILLNESS:  Stefanie Phelps is a 87 y.o. female with a history of ***  The patient is an 87 year old woman with myelodysplasia-refractory anemia with ring sideroblasts.  The patient has been taking luspatercept over these past few months, which has been effective in keeping her hemoglobin above 10.  She comes in today to reassess her myelodysplasia.  Since her last visit, the patient has been doing fairly well.  She denies having increased fatigue.  Her luspatercept injections have been effective to where she has not needed a blood transfusion since September 2020.     INTERVAL HISTORY:  Stefanie Phelps is here today for routine follow up of her ***. She denies fevers or chills. She  denies pain. Her appetite is good. Her weight {Weight change:10426}.  REVIEW OF SYSTEMS:  Review of Systems - Oncology   VITALS:  There were no vitals taken for this visit.  Wt Readings from Last 3 Encounters:  11/24/20 120 lb 12 oz (54.8 kg)  10/08/20 120 lb (54.4 kg)  11/03/20 121 lb 4 oz (55 kg)    There is no height or weight on file to calculate BMI.  Performance status (ECOG): {CHL ONC [id number]  PHYSICAL EXAM:  Physical Exam Lymph nodes:   There is no cervical, clavicular, axillary or inguinal lymphadenopathy.   LABS:   CBC Latest Ref Rng & Units 11/20/2020 10/30/2020 10/08/2020  WBC - 6.1 6.8 7.1  Hemoglobin 12.0 - 16.0 10.4(A) 10.6(A) 10.0(A)  Hematocrit 36 - 46 33(A) 33(A) 32(A)  Platelets 150 - 399 277 248 255   CMP Latest Ref Rng & Units 11/20/2020 10/30/2020 10/08/2020  Glucose 70 - 99 mg/dL - - -  BUN 4 - _0 Creatinine 0.5 - 1.1 1.3(A) 1.2(A) 1.3(A)  Sodium 137 - 147 143 143 141  Potassium 3.4 - 5.3 4.1 3.6 4.2  Chloride 99 -  108 109(A) 115(A) 111(A)  CO2 13 - 22 24(A) 22 24(A)  Calcium 8.7 - 10.7 9.4 9.3 9.4  Alkaline Phos 25 - 125 59 56 70  AST 13 - 35 41(A) 41(A) 44(A)  ALT 7 - 35 _1 No results found for: CEA1 / No results found for: CEA1 No results found for: PSA1 No results found for: SEG315 No results found for: VVO160  No results found for: TOTALPROTELP, ALBUMINELP, A1GS, A2GS, BETS, BETA2SER, GAMS, MSPIKE, SPEI No results found for: TIBC, FERRITIN, IRONPCTSAT No results found for: LDH  STUDIES:  No results found.    HISTORY:   Past Medical History:  Diagnosis Date  . Anemia   . 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 Anemia associated with myeloproliferative disorder treated with erythropoietin ([place])   . Anemia, unspecified   . Aortic stenosis   . COPD (chronic obstructive pulmonary disease) ([place])   . Diabetes ([place])   . Hypertension   . Myelodysplastic disease ([place])   . Myelodysplastic syndrome ([place])   . Psoriasis     Past Surgical History:  Procedure Laterality Date  . ABDOMINAL HYSTERECTOMY    . CHOLECYSTECTOMY      Family History  Problem Relation Age of Onset  . Hypertension Mother   . Diabetes  Mother   . Heart disease Mother   . Hypertension Father   . Diabetes Father   . Prostate cancer Father   . Lung cancer Brother   . Heart disease Brother     Social History:  reports that she has been smoking. She has never used smokeless tobacco. She reports that she does not drink alcohol and does not use drugs.The patient is {Blank single:19197::"alone","accompanied by"} *** today.  Allergies: No Known Allergies  Current Medications: Current Outpatient Medications  Medication Sig Dispense Refill  . Adalimumab 40 MG/0.4ML PNKT Inject 40 mg into the skin every Sunday.     Marland Kitchen allopurinol (ZYLOPRIM) 300 MG tablet Take 300 mg by mouth daily.    Marland Kitchen CLOBETASOL PROPIONATE EMULSION EX Apply 1 application topically 2 (two)  times daily as needed (legs).     . Cyanocobalamin (B-12 COMPLIANCE INJECTION) 1000 MCG/ML KIT Inject 1,000 mcg as directed every 30 (thirty) days.    . enalapril (VASOTEC) 20 MG tablet Take 20 mg by mouth daily.    . Fluticasone-Salmeterol (ADVAIR) 250-50 MCG/DOSE AEPB Inhale 1 puff into the lungs 2 (two) times daily.    . furosemide (LASIX) 20 MG tablet Take 20 mg by mouth daily.    . hydrALAZINE (APRESOLINE) 25 MG tablet Take 25 mg by mouth 2 (two) times daily.    Marland Kitchen HYDROcodone-acetaminophen (NORCO) 10-325 MG tablet Take 1 tablet by mouth 4 (four) times daily as needed.    Marland Kitchen levofloxacin (LEVAQUIN) 500 MG tablet Take 500 mg by mouth daily.    Marland Kitchen lovastatin (MEVACOR) 40 MG tablet Take 40 mg by mouth daily.    Marland Kitchen luspatercept-aamt (REBLOZYL) 25 MG subcutaneous injection Inject 1 mg/kg into the skin once.    . metoprolol succinate (TOPROL-XL) 50 MG 24 hr tablet Take 1 tablet (50 mg total) by mouth daily for 30 days. 30 tablet 0  . mupirocin ointment (BACTROBAN) 2 % 1 application as needed.    . sitaGLIPtin (JANUVIA) 100 MG tablet Take 100 mg by mouth daily.    . TRIAMCINOLONE ACETONIDE, TOP, 0.05 % OINT Apply 1 application topically 2 (two) times daily as needed (legs).     . Vitamin D, Ergocalciferol, (DRISDOL) 50000 units CAPS capsule Take 50,000 Units by mouth every Sunday.      No current facility-administered medications for this visit.     ASSESSMENT & PLAN:   Assessment:  Stefanie Phelps is a 87 y.o. female ***  Plan: *** An 87 year old woman with myelodysplasia-RARS.  When evaluating her hemoglobin today, I am pleased as her hemoglobin is back to 10.  She will continue taking luspatercept every 3 weeks to keep her hemoglobin at/above 10.  She clinically appears to be doing fine.  I will see her back in 12 weeks for repeat clinical assessment.  The patient understands all the plans discussed today and is in agreement with them.   The patient understands the plans discussed today and is  in agreement with them.  She knows to contact our office if she develops concerns prior to her next appointment.   I provided *** minutes (8:30 AM - 8:30 AM) of face-to-face time during this this encounter and > 50% was spent counseling as documented under my assessment and plan.    Melodye Ped, NP

## 2021-01-01 ENCOUNTER — Telehealth: Payer: Self-pay | Admitting: Oncology

## 2021-01-01 NOTE — Telephone Encounter (Signed)
01/01/21 Spoke with patient and sched next appt

## 2021-01-15 DIAGNOSIS — N183 Chronic kidney disease, stage 3 unspecified: Secondary | ICD-10-CM | POA: Diagnosis not present

## 2021-01-15 DIAGNOSIS — R5383 Other fatigue: Secondary | ICD-10-CM | POA: Diagnosis not present

## 2021-01-15 DIAGNOSIS — E118 Type 2 diabetes mellitus with unspecified complications: Secondary | ICD-10-CM | POA: Diagnosis not present

## 2021-01-15 DIAGNOSIS — L4050 Arthropathic psoriasis, unspecified: Secondary | ICD-10-CM | POA: Diagnosis not present

## 2021-01-15 DIAGNOSIS — J449 Chronic obstructive pulmonary disease, unspecified: Secondary | ICD-10-CM | POA: Diagnosis not present

## 2021-01-15 DIAGNOSIS — M109 Gout, unspecified: Secondary | ICD-10-CM | POA: Diagnosis not present

## 2021-01-15 DIAGNOSIS — C946 Myelodysplastic disease, not classified: Secondary | ICD-10-CM | POA: Diagnosis not present

## 2021-01-15 DIAGNOSIS — E538 Deficiency of other specified B group vitamins: Secondary | ICD-10-CM | POA: Diagnosis not present

## 2021-01-15 DIAGNOSIS — I50810 Right heart failure, unspecified: Secondary | ICD-10-CM | POA: Diagnosis not present

## 2021-01-16 ENCOUNTER — Inpatient Hospital Stay (HOSPITAL_COMMUNITY)
Admission: EM | Admit: 2021-01-16 | Discharge: 2021-01-19 | DRG: 312 | Disposition: A | Discharge status: Home Health | Payer: Medicare PPO | Attending: Student | Admitting: Student

## 2021-01-16 ENCOUNTER — Emergency Department (HOSPITAL_COMMUNITY): Payer: Medicare PPO

## 2021-01-16 ENCOUNTER — Encounter (HOSPITAL_COMMUNITY): Payer: Self-pay

## 2021-01-16 DIAGNOSIS — R296 Repeated falls: Secondary | ICD-10-CM | POA: Diagnosis not present

## 2021-01-16 DIAGNOSIS — T447X5A Adverse effect of beta-adrenoreceptor antagonists, initial encounter: Secondary | ICD-10-CM | POA: Diagnosis present

## 2021-01-16 DIAGNOSIS — R8271 Bacteriuria: Secondary | ICD-10-CM | POA: Diagnosis not present

## 2021-01-16 DIAGNOSIS — D461 Refractory anemia with ring sideroblasts: Secondary | ICD-10-CM | POA: Diagnosis not present

## 2021-01-16 DIAGNOSIS — E876 Hypokalemia: Secondary | ICD-10-CM

## 2021-01-16 DIAGNOSIS — Z833 Family history of diabetes mellitus: Secondary | ICD-10-CM

## 2021-01-16 DIAGNOSIS — E440 Moderate protein-calorie malnutrition: Secondary | ICD-10-CM | POA: Diagnosis present

## 2021-01-16 DIAGNOSIS — E861 Hypovolemia: Secondary | ICD-10-CM | POA: Diagnosis not present

## 2021-01-16 DIAGNOSIS — E860 Dehydration: Secondary | ICD-10-CM

## 2021-01-16 DIAGNOSIS — I248 Other forms of acute ischemic heart disease: Secondary | ICD-10-CM | POA: Diagnosis not present

## 2021-01-16 DIAGNOSIS — I480 Paroxysmal atrial fibrillation: Secondary | ICD-10-CM | POA: Diagnosis not present

## 2021-01-16 DIAGNOSIS — D469 Myelodysplastic syndrome, unspecified: Secondary | ICD-10-CM | POA: Diagnosis present

## 2021-01-16 DIAGNOSIS — Z8249 Family history of ischemic heart disease and other diseases of the circulatory system: Secondary | ICD-10-CM

## 2021-01-16 DIAGNOSIS — I959 Hypotension, unspecified: Secondary | ICD-10-CM

## 2021-01-16 DIAGNOSIS — I5032 Chronic diastolic (congestive) heart failure: Secondary | ICD-10-CM | POA: Diagnosis not present

## 2021-01-16 DIAGNOSIS — R531 Weakness: Secondary | ICD-10-CM | POA: Diagnosis not present

## 2021-01-16 DIAGNOSIS — Y929 Unspecified place or not applicable: Secondary | ICD-10-CM

## 2021-01-16 DIAGNOSIS — I351 Nonrheumatic aortic (valve) insufficiency: Secondary | ICD-10-CM | POA: Diagnosis not present

## 2021-01-16 DIAGNOSIS — E119 Type 2 diabetes mellitus without complications: Secondary | ICD-10-CM

## 2021-01-16 DIAGNOSIS — H9190 Unspecified hearing loss, unspecified ear: Secondary | ICD-10-CM | POA: Diagnosis present

## 2021-01-16 DIAGNOSIS — Z515 Encounter for palliative care: Secondary | ICD-10-CM | POA: Diagnosis not present

## 2021-01-16 DIAGNOSIS — Z681 Body mass index (BMI) 19 or less, adult: Secondary | ICD-10-CM

## 2021-01-16 DIAGNOSIS — J449 Chronic obstructive pulmonary disease, unspecified: Secondary | ICD-10-CM | POA: Diagnosis present

## 2021-01-16 DIAGNOSIS — N179 Acute kidney failure, unspecified: Secondary | ICD-10-CM | POA: Diagnosis not present

## 2021-01-16 DIAGNOSIS — S199XXA Unspecified injury of neck, initial encounter: Secondary | ICD-10-CM | POA: Diagnosis not present

## 2021-01-16 DIAGNOSIS — E1122 Type 2 diabetes mellitus with diabetic chronic kidney disease: Secondary | ICD-10-CM | POA: Diagnosis present

## 2021-01-16 DIAGNOSIS — I350 Nonrheumatic aortic (valve) stenosis: Secondary | ICD-10-CM | POA: Diagnosis present

## 2021-01-16 DIAGNOSIS — R031 Nonspecific low blood-pressure reading: Secondary | ICD-10-CM | POA: Diagnosis not present

## 2021-01-16 DIAGNOSIS — R197 Diarrhea, unspecified: Secondary | ICD-10-CM

## 2021-01-16 DIAGNOSIS — N3000 Acute cystitis without hematuria: Secondary | ICD-10-CM | POA: Diagnosis not present

## 2021-01-16 DIAGNOSIS — R059 Cough, unspecified: Secondary | ICD-10-CM | POA: Diagnosis not present

## 2021-01-16 DIAGNOSIS — U071 COVID-19: Secondary | ICD-10-CM

## 2021-01-16 DIAGNOSIS — F17200 Nicotine dependence, unspecified, uncomplicated: Secondary | ICD-10-CM | POA: Diagnosis present

## 2021-01-16 DIAGNOSIS — W19XXXA Unspecified fall, initial encounter: Secondary | ICD-10-CM | POA: Diagnosis present

## 2021-01-16 DIAGNOSIS — R410 Disorientation, unspecified: Secondary | ICD-10-CM | POA: Diagnosis not present

## 2021-01-16 DIAGNOSIS — S0990XA Unspecified injury of head, initial encounter: Secondary | ICD-10-CM | POA: Diagnosis present

## 2021-01-16 DIAGNOSIS — N183 Chronic kidney disease, stage 3 unspecified: Secondary | ICD-10-CM | POA: Diagnosis not present

## 2021-01-16 DIAGNOSIS — I130 Hypertensive heart and chronic kidney disease with heart failure and stage 1 through stage 4 chronic kidney disease, or unspecified chronic kidney disease: Secondary | ICD-10-CM | POA: Diagnosis not present

## 2021-01-16 DIAGNOSIS — R001 Bradycardia, unspecified: Secondary | ICD-10-CM | POA: Diagnosis not present

## 2021-01-16 DIAGNOSIS — R7981 Abnormal blood-gas level: Secondary | ICD-10-CM | POA: Diagnosis not present

## 2021-01-16 DIAGNOSIS — Z87440 Personal history of urinary (tract) infections: Secondary | ICD-10-CM

## 2021-01-16 DIAGNOSIS — Z8616 Personal history of COVID-19: Secondary | ICD-10-CM

## 2021-01-16 DIAGNOSIS — Z66 Do not resuscitate: Secondary | ICD-10-CM | POA: Diagnosis not present

## 2021-01-16 DIAGNOSIS — R778 Other specified abnormalities of plasma proteins: Secondary | ICD-10-CM | POA: Diagnosis not present

## 2021-01-16 DIAGNOSIS — N3001 Acute cystitis with hematuria: Secondary | ICD-10-CM

## 2021-01-16 DIAGNOSIS — Z7189 Other specified counseling: Secondary | ICD-10-CM | POA: Diagnosis not present

## 2021-01-16 DIAGNOSIS — N1831 Chronic kidney disease, stage 3a: Secondary | ICD-10-CM | POA: Diagnosis present

## 2021-01-16 DIAGNOSIS — I951 Orthostatic hypotension: Secondary | ICD-10-CM | POA: Diagnosis not present

## 2021-01-16 DIAGNOSIS — C946 Myelodysplastic disease, not classified: Secondary | ICD-10-CM | POA: Diagnosis not present

## 2021-01-16 DIAGNOSIS — Z20822 Contact with and (suspected) exposure to covid-19: Secondary | ICD-10-CM | POA: Diagnosis present

## 2021-01-16 DIAGNOSIS — R21 Rash and other nonspecific skin eruption: Secondary | ICD-10-CM | POA: Diagnosis not present

## 2021-01-16 DIAGNOSIS — R0902 Hypoxemia: Secondary | ICD-10-CM | POA: Diagnosis not present

## 2021-01-16 DIAGNOSIS — Z79899 Other long term (current) drug therapy: Secondary | ICD-10-CM

## 2021-01-16 HISTORY — DX: Heart failure, unspecified: I50.9

## 2021-01-16 HISTORY — DX: Dehydration: E86.0

## 2021-01-16 HISTORY — DX: Repeated falls: R29.6

## 2021-01-16 HISTORY — DX: Diarrhea, unspecified: R19.7

## 2021-01-16 HISTORY — DX: Type 2 diabetes mellitus without complications: E11.9

## 2021-01-16 HISTORY — DX: Unspecified fall, initial encounter: W19.XXXA

## 2021-01-16 HISTORY — DX: COVID-19: U07.1

## 2021-01-16 HISTORY — DX: Acute kidney failure, unspecified: N17.9

## 2021-01-16 HISTORY — DX: Chronic kidney disease, stage 3 unspecified: N18.30

## 2021-01-16 HISTORY — DX: Hypokalemia: E87.6

## 2021-01-16 HISTORY — DX: Dyspnea, unspecified: R06.00

## 2021-01-16 LAB — CBC WITH DIFFERENTIAL/PLATELET
Abs Immature Granulocytes: 0.07 10*3/uL (ref 0.00–0.07)
Basophils Absolute: 0.1 10*3/uL (ref 0.0–0.1)
Basophils Relative: 1 %
Eosinophils Absolute: 0.2 10*3/uL (ref 0.0–0.5)
Eosinophils Relative: 5 %
HCT: 32.2 % — ABNORMAL LOW (ref 36.0–46.0)
Hemoglobin: 10 g/dL — ABNORMAL LOW (ref 12.0–15.0)
Immature Granulocytes: 1 %
Lymphocytes Relative: 36 %
Lymphs Abs: 1.9 10*3/uL (ref 0.7–4.0)
MCH: 31.5 pg (ref 26.0–34.0)
MCHC: 31.1 g/dL (ref 30.0–36.0)
MCV: 101.6 fL — ABNORMAL HIGH (ref 80.0–100.0)
Monocytes Absolute: 0.4 10*3/uL (ref 0.1–1.0)
Monocytes Relative: 7 %
Neutro Abs: 2.7 10*3/uL (ref 1.7–7.7)
Neutrophils Relative %: 50 %
Platelets: 288 10*3/uL (ref 150–400)
RBC: 3.17 MIL/uL — ABNORMAL LOW (ref 3.87–5.11)
RDW: 18.7 % — ABNORMAL HIGH (ref 11.5–15.5)
WBC: 5.3 10*3/uL (ref 4.0–10.5)
nRBC: 0 % (ref 0.0–0.2)

## 2021-01-16 LAB — TROPONIN I (HIGH SENSITIVITY)
Troponin I (High Sensitivity): 30 ng/L — ABNORMAL HIGH (ref <18)
Troponin I (High Sensitivity): 31 ng/L — ABNORMAL HIGH (ref <18)

## 2021-01-16 LAB — URINALYSIS, ROUTINE W REFLEX MICROSCOPIC
Bilirubin Urine: NEGATIVE
Glucose, UA: NEGATIVE mg/dL
Ketones, ur: 5 mg/dL — AB
Nitrite: NEGATIVE
Protein, ur: 30 mg/dL — AB
Specific Gravity, Urine: 1.017 (ref 1.005–1.030)
WBC, UA: >50 WBC/hpf — ABNORMAL HIGH (ref 0–5)
pH: 5 (ref 5.0–8.0)

## 2021-01-16 LAB — COMPREHENSIVE METABOLIC PANEL
ALT: 13 U/L (ref 0–44)
AST: 24 U/L (ref 15–41)
Albumin: 2.7 g/dL — ABNORMAL LOW (ref 3.5–5.0)
Alkaline Phosphatase: 44 U/L (ref 38–126)
Anion gap: 11 (ref 5–15)
BUN: 42 mg/dL — ABNORMAL HIGH (ref 8–23)
CO2: 19 mmol/L — ABNORMAL LOW (ref 22–32)
Calcium: 8.7 mg/dL — ABNORMAL LOW (ref 8.9–10.3)
Chloride: 109 mmol/L (ref 98–111)
Creatinine, Ser: 1.69 mg/dL — ABNORMAL HIGH (ref 0.44–1.00)
GFR, Estimated: 29 mL/min — ABNORMAL LOW (ref >60)
Glucose, Bld: 102 mg/dL — ABNORMAL HIGH (ref 70–99)
Potassium: 2.9 mmol/L — ABNORMAL LOW (ref 3.5–5.1)
Sodium: 139 mmol/L (ref 135–145)
Total Bilirubin: 1.3 mg/dL — ABNORMAL HIGH (ref 0.3–1.2)
Total Protein: 6.5 g/dL (ref 6.5–8.1)

## 2021-01-16 LAB — LACTIC ACID, PLASMA
Lactic Acid, Venous: 0.9 mmol/L (ref 0.5–1.9)
Lactic Acid, Venous: 0.8 mmol/L (ref 0.5–1.9)

## 2021-01-16 LAB — MAGNESIUM: Magnesium: 2.1 mg/dL (ref 1.7–2.4)

## 2021-01-16 MED ORDER — SODIUM CHLORIDE 0.9 % IV SOLN
1.0000 g | Freq: Once | INTRAVENOUS | Status: AC
  Administered 2021-01-17: 1 g via INTRAVENOUS
  Filled 2021-01-16: qty 10

## 2021-01-16 MED ORDER — LACTATED RINGERS IV BOLUS
1000.0000 mL | Freq: Once | INTRAVENOUS | Status: AC
  Administered 2021-01-17: 1000 mL via INTRAVENOUS

## 2021-01-16 MED ORDER — POTASSIUM CHLORIDE 20 MEQ PO PACK
40.0000 meq | PACK | Freq: Once | ORAL | Status: AC
  Administered 2021-01-16: 40 meq via ORAL
  Filled 2021-01-16: qty 2

## 2021-01-16 NOTE — ED Triage Notes (Signed)
Assume care from EMS. Ems Reports pt was brought in to PCP by family due to increase weakness, decrease appetite and multiple falls at home with no injury. EMS States at PCP office pt was hypotension upon arrival 64/42 and 700cc of NS was given BP improve 112/58 EMS reports pt had covid 1/19 and hx of bone cancer and is not receiving any form of treatment.  112/58 96 RA  Temp 97.1 CBG 139 HR 52

## 2021-01-16 NOTE — ED Notes (Signed)
Pt did not tolerate ambulation well, Pt was able to stand up and had to hold on to this RN with hands around RN. Pt reports feeling really weak notified MD and orthostatic values as well

## 2021-01-16 NOTE — ED Notes (Signed)
Pt to CT scan.

## 2021-01-16 NOTE — H&P (Signed)
History and Physical    Stefanie Phelps TIR:[id number] DOB: 09/12/1930 DOA: 01/16/2021  PCP: Penelope Coop, FNP   Patient coming from:  home  Chief Complaint: weakness and falls at home  HPI: Stefanie Phelps is a 87 y.o. female with medical history significant for myelodysplastic syndrome, anemia, COPD, DM, hypertension, who was reportedly Covid positive on 1/19. She presents today for evaluation of weakness, decreased appetite and multiple falls at home.  She does not take any blood thinning medications. She states she has had multiple falls at home over the past 1-2 weeks. She has not had seizure activity she states. She reports having diarrhea multiple times a day for the past 1-2 weeks. She was treated for a UTI by PCP recently.  She reports pain in her face, left sided which she says was from when she fell two weeks ago.  She denies any chest pain or palpitations. She denies pain with urination or frequent urination.   Patient went to her PCP office today and was found to be hypotensive and sent by EMS to hospital.  EMS reports that her initial blood pressure was 64/42, she received 700 cc normal saline in route and BP improved to 112/58.  ED Course: BP improved with IVF but still orthostatic with SBP falling from 130 to 70 when stands up per ER provider report. Found to have AKI.   Review of Systems:  General: Reports generalized weakness and falls at home. Denies fever, chills, weight loss, night sweats.  Denies dizziness.  HENT: Denies head trauma, headache, denies change in hearing, tinnitus. Denies nasal congestion or bleeding.  Denies sore throat, sores in mouth.  Denies difficulty swallowing Eyes: Denies blurry vision, pain in eye, drainage.  Denies discoloration of eyes. Neck: Denies pain.  Denies swelling.  Denies pain with movement. Cardiovascular: Denies chest pain, palpitations.  Denies edema.  Denies orthopnea Respiratory: Denies shortness of breath, cough.  Denies wheezing.   Denies sputum production Gastrointestinal: Reports diarrhea. Denies abdominal pain, swelling.  Denies nausea, vomiting.  Denies melena.  Denies hematemesis. Musculoskeletal: Denies limitation of movement.  Denies deformity or swelling.   Genitourinary: Denies pelvic pain.  Denies urinary frequency or hesitancy.  Denies dysuria.  Skin: Denies rash.  Denies petechiae, purpura, ecchymosis. Neurological: Denies headache. Denies seizure activity.  Denies paresthesia.  Denies slurred speech, drooping face.  Denies visual change. Psychiatric: Denies depression, anxiety.  Denies hallucinations.  Past Medical History:  Diagnosis Date  . Anemia   . Anemia associated with myeloproliferative disorder treated with erythropoietin ([place])   . Anemia associated with myeloproliferative disorder treated with erythropoietin ([place])   . Anemia, unspecified   . Aortic stenosis   . COPD (chronic obstructive pulmonary disease) ([place])   . Diabetes ([place])   . Hypertension   . Myelodysplastic disease ([place])   . Myelodysplastic syndrome ([place])   . Psoriasis     Past Surgical History:  Procedure Laterality Date  . ABDOMINAL HYSTERECTOMY    . CHOLECYSTECTOMY      Social History  reports that she has been smoking. She has never used smokeless tobacco. She reports that she does not drink alcohol and does not use drugs.  No Known Allergies  Family History  Problem Relation Age of Onset  . Hypertension Mother   . Diabetes Mother   . Heart disease Mother   . Hypertension Father   . Diabetes Father   . Prostate cancer Father   . Lung cancer Brother   . Heart  disease Brother      Prior to Admission medications   Medication Sig Start Date End Date Taking? Authorizing Provider  cephALEXin (KEFLEX) 500 MG capsule Take 500 mg by mouth 3 (three) times daily. 12/31/20  Yes [provider]  metoprolol succinate (TOPROL-XL) 50 MG 24 hr tablet Take 1 tablet (50 mg total) by mouth daily for 30 days. 03/19/19  04/18/19 Yes Spongberg, Audie Pinto, MD  Adalimumab 40 MG/0.4ML PNKT Inject 40 mg into the skin every Sunday.     [provider]  allopurinol (ZYLOPRIM) 300 MG tablet Take 300 mg by mouth daily. 02/22/19   [provider]  CLOBETASOL PROPIONATE EMULSION EX Apply 1 application topically 2 (two) times daily as needed (legs).     [provider]  Cyanocobalamin 1000 MCG/ML KIT Inject 1,000 mcg as directed every 30 (thirty) days.    [provider]  enalapril (VASOTEC) 20 MG tablet Take 20 mg by mouth daily. 02/22/19   [provider]  Fluticasone-Salmeterol (ADVAIR) 250-50 MCG/DOSE AEPB Inhale 1 puff into the lungs 2 (two) times daily.    [provider]  furosemide (LASIX) 20 MG tablet Take 20 mg by mouth daily. 02/22/19   [provider]  hydrALAZINE (APRESOLINE) 25 MG tablet Take 25 mg by mouth 2 (two) times daily. 12/12/18   [provider]  HYDROcodone-acetaminophen (NORCO) 10-325 MG tablet Take 1 tablet by mouth 4 (four) times daily as needed.    [provider]  levofloxacin (LEVAQUIN) 500 MG tablet Take 500 mg by mouth daily.    [provider]  lovastatin (MEVACOR) 40 MG tablet Take 40 mg by mouth daily. 02/22/19   [provider]  luspatercept-aamt (REBLOZYL) 25 MG subcutaneous injection Inject 1 mg/kg into the skin once.    [provider]  mupirocin ointment (BACTROBAN) 2 % 1 application as needed.    [provider]  sitaGLIPtin (JANUVIA) 100 MG tablet Take 100 mg by mouth daily.    [provider]  TRIAMCINOLONE ACETONIDE, TOP, 0.05 % OINT Apply 1 application topically 2 (two) times daily as needed (legs).     [provider]  Vitamin D, Ergocalciferol, (DRISDOL) 50000 units CAPS capsule Take 50,000 Units by mouth every Sunday.     [provider]    Physical Exam: Vitals:   01/16/21 2045 01/16/21 2130 01/16/21 2230 01/16/21 2245  BP: (!) 138/53  (!) 130/54 130/65 (!) 137/55  Pulse: (!) 46 (!) 48 (!) 50 (!) 49  Resp: 15 16  14   Temp:      TempSrc:      SpO2: 96% 93%  95%    Constitutional: NAD, calm, comfortable Vitals:   01/16/21 2045 01/16/21 2130 01/16/21 2230 01/16/21 2245  BP: (!) 138/53 (!) 130/54 130/65 (!) 137/55  Pulse: (!) 46 (!) 48 (!) 50 (!) 49  Resp: 15 16  14   Temp:      TempSrc:      SpO2: 96% 93%  95%   General: Thin elderly female. Hard of hearing Eyes: EOMI, PERRL, conjunctivae normal.  Sclera nonicteric HENT:  /AT, external ears normal.  Nares patent without epistasis.  Mucous membranes are dry. Posterior pharynx clear of any exudate or lesions. Neck: Soft, normal range of motion, supple, no masses, no thyromegaly.  Trachea midline Respiratory: clear to auscultation bilaterally, no wheezing, no crackles. Normal respiratory effort. No accessory muscle use.  Cardiovascular: Sinus bradycardia, no murmurs / rubs / gallops. No extremity edema. 2+ pedal  pulses. Abdomen: Soft, no tenderness, nondistended, no rebound or guarding.  No masses palpated. No hepatosplenomegaly. Bowel sounds normoactive Musculoskeletal: FROM. no cyanosis. No joint deformity upper and lower extremities. Normal muscle tone.  Skin: Warm, dry, intact no rashes, lesions, ulcers. No induration. Has bruise on left side of face Neurologic: CN 2-12 grossly intact. Normal speech. Sensation intact, Strength 5/5 in all extremities.   Psychiatric: Normal mood.    Labs on Admission: I have personally reviewed following labs and imaging studies  CBC: Recent Labs  Lab 01/16/21 1746  WBC 5.3  NEUTROABS 2.7  HGB 10.0*  HCT 32.2*  MCV 101.6*  PLT 852    Basic Metabolic Panel: Recent Labs  Lab 01/16/21 1746 01/16/21 2036  NA 139  --   K 2.9*  --   CL 109  --   CO2 19*  --   GLUCOSE 102*  --   BUN 42*  --   CREATININE 1.69*  --   CALCIUM 8.7*  --   MG  --  2.1    GFR: CrCl cannot be calculated (Unknown ideal weight.).  Liver  Function Tests: Recent Labs  Lab 01/16/21 1746  AST 24  ALT 13  ALKPHOS 44  BILITOT 1.3*  PROT 6.5  ALBUMIN 2.7*    Urine analysis:    Component Value Date/Time   COLORURINE YELLOW 01/16/2021 2254   APPEARANCEUR HAZY (A) 01/16/2021 2254   LABSPEC 1.017 01/16/2021 2254   PHURINE 5.0 01/16/2021 2254   GLUCOSEU NEGATIVE 01/16/2021 2254   HGBUR MODERATE (A) 01/16/2021 2254   BILIRUBINUR NEGATIVE 01/16/2021 2254   KETONESUR 5 (A) 01/16/2021 2254   PROTEINUR 30 (A) 01/16/2021 2254   NITRITE NEGATIVE 01/16/2021 2254   LEUKOCYTESUR LARGE (A) 01/16/2021 2254    Radiological Exams on Admission: CT Head Wo Contrast  Result Date: 01/16/2021 CLINICAL DATA:  Head trauma. EXAM: CT HEAD WITHOUT CONTRAST CT MAXILLOFACIAL WITHOUT CONTRAST CT CERVICAL SPINE WITHOUT CONTRAST TECHNIQUE: Multidetector CT imaging of the head, cervical spine, and maxillofacial structures were performed using the standard protocol without intravenous contrast. Multiplanar CT image reconstructions of the cervical spine and maxillofacial structures were also generated. COMPARISON:  December 31, 2020. FINDINGS: CT HEAD FINDINGS Brain: No evidence of acute large vascular territory infarction, hemorrhage, hydrocephalus, extra-axial collection or mass lesion/mass effect. Similar patchy white matter hypoattenuation, likely relating to chronic microvascular ischemic disease. Vascular: Calcific atherosclerosis. Skull: No acute fracture. Other: No mastoid effusions. CT MAXILLOFACIAL FINDINGS Osseous: No fracture or mandibular dislocation. No destructive process. Orbits: Negative. No traumatic or inflammatory finding. Sinuses: Inferior left maxillary sinus in scattered ethmoid air cell mucosal thickening. Trace frothy secretions in the right sphenoid sinus. Soft tissues: Negative. CT CERVICAL SPINE FINDINGS Alignment: Similar alignment comparison to prior, including 2 mm of anterolisthesis of C4 on C5 and trace retrolisthesis of C5 on C6.  Skull base and vertebrae: No evidence acute fracture. Vertebral body heights are maintained. Osteopenia. Soft tissues and spinal canal: No prevertebral fluid or swelling. No visible canal hematoma. Disc levels: Multilevel intervertebral degenerative change with disc height loss most pronounced at C5-C6 and C6-C7. Multilevel right greater than left facet hypertrophy. Upper chest: Visualized lung apices are clear. Other: Bilateral carotid atherosclerosis. IMPRESSION: CT head: 1. No evidence of acute intra cranial abnormality. 2. Similar chronic microvascular ischemic disease and generalized cerebral volume loss. CT maxillofacial: 1. No acute fracture. CT cervical spine: 1. No acute fracture or traumatic malalignment. 2. Similar multilevel degenerative disc disease and facet arthropathy. Electronically Signed  By: Margaretha Sheffield MD   On: 01/16/2021 20:40   CT Cervical Spine Wo Contrast  Result Date: 01/16/2021 CLINICAL DATA:  Head trauma. EXAM: CT HEAD WITHOUT CONTRAST CT MAXILLOFACIAL WITHOUT CONTRAST CT CERVICAL SPINE WITHOUT CONTRAST TECHNIQUE: Multidetector CT imaging of the head, cervical spine, and maxillofacial structures were performed using the standard protocol without intravenous contrast. Multiplanar CT image reconstructions of the cervical spine and maxillofacial structures were also generated. COMPARISON:  December 31, 2020. FINDINGS: CT HEAD FINDINGS Brain: No evidence of acute large vascular territory infarction, hemorrhage, hydrocephalus, extra-axial collection or mass lesion/mass effect. Similar patchy white matter hypoattenuation, likely relating to chronic microvascular ischemic disease. Vascular: Calcific atherosclerosis. Skull: No acute fracture. Other: No mastoid effusions. CT MAXILLOFACIAL FINDINGS Osseous: No fracture or mandibular dislocation. No destructive process. Orbits: Negative. No traumatic or inflammatory finding. Sinuses: Inferior left maxillary sinus in scattered ethmoid air  cell mucosal thickening. Trace frothy secretions in the right sphenoid sinus. Soft tissues: Negative. CT CERVICAL SPINE FINDINGS Alignment: Similar alignment comparison to prior, including 2 mm of anterolisthesis of C4 on C5 and trace retrolisthesis of C5 on C6. Skull base and vertebrae: No evidence acute fracture. Vertebral body heights are maintained. Osteopenia. Soft tissues and spinal canal: No prevertebral fluid or swelling. No visible canal hematoma. Disc levels: Multilevel intervertebral degenerative change with disc height loss most pronounced at C5-C6 and C6-C7. Multilevel right greater than left facet hypertrophy. Upper chest: Visualized lung apices are clear. Other: Bilateral carotid atherosclerosis. IMPRESSION: CT head: 1. No evidence of acute intra cranial abnormality. 2. Similar chronic microvascular ischemic disease and generalized cerebral volume loss. CT maxillofacial: 1. No acute fracture. CT cervical spine: 1. No acute fracture or traumatic malalignment. 2. Similar multilevel degenerative disc disease and facet arthropathy. Electronically Signed   By: Margaretha Sheffield MD   On: 01/16/2021 20:40   DG Chest Port 1 View  Result Date: 01/16/2021 CLINICAL DATA:  87 year old female with cough. EXAM: PORTABLE CHEST 1 VIEW COMPARISON:  Chest radiograph dated 12/31/2020 FINDINGS: No focal consolidation, pleural effusion, pneumothorax. The cardiac silhouette is within limits. Atherosclerotic calcification of the aorta. Osteopenia with degenerative changes of the spine. No acute osseous pathology. IMPRESSION: No active disease. Electronically Signed   By: Anner Crete M.D.   On: 01/16/2021 18:04   CT Maxillofacial WO CM  Result Date: 01/16/2021 CLINICAL DATA:  Head trauma. EXAM: CT HEAD WITHOUT CONTRAST CT MAXILLOFACIAL WITHOUT CONTRAST CT CERVICAL SPINE WITHOUT CONTRAST TECHNIQUE: Multidetector CT imaging of the head, cervical spine, and maxillofacial structures were performed using the standard  protocol without intravenous contrast. Multiplanar CT image reconstructions of the cervical spine and maxillofacial structures were also generated. COMPARISON:  December 31, 2020. FINDINGS: CT HEAD FINDINGS Brain: No evidence of acute large vascular territory infarction, hemorrhage, hydrocephalus, extra-axial collection or mass lesion/mass effect. Similar patchy white matter hypoattenuation, likely relating to chronic microvascular ischemic disease. Vascular: Calcific atherosclerosis. Skull: No acute fracture. Other: No mastoid effusions. CT MAXILLOFACIAL FINDINGS Osseous: No fracture or mandibular dislocation. No destructive process. Orbits: Negative. No traumatic or inflammatory finding. Sinuses: Inferior left maxillary sinus in scattered ethmoid air cell mucosal thickening. Trace frothy secretions in the right sphenoid sinus. Soft tissues: Negative. CT CERVICAL SPINE FINDINGS Alignment: Similar alignment comparison to prior, including 2 mm of anterolisthesis of C4 on C5 and trace retrolisthesis of C5 on C6. Skull base and vertebrae: No evidence acute fracture. Vertebral body heights are maintained. Osteopenia. Soft tissues and spinal canal: No prevertebral fluid or swelling. No visible  canal hematoma. Disc levels: Multilevel intervertebral degenerative change with disc height loss most pronounced at C5-C6 and C6-C7. Multilevel right greater than left facet hypertrophy. Upper chest: Visualized lung apices are clear. Other: Bilateral carotid atherosclerosis. IMPRESSION: CT head: 1. No evidence of acute intra cranial abnormality. 2. Similar chronic microvascular ischemic disease and generalized cerebral volume loss. CT maxillofacial: 1. No acute fracture. CT cervical spine: 1. No acute fracture or traumatic malalignment. 2. Similar multilevel degenerative disc disease and facet arthropathy. Electronically Signed   By: Margaretha Sheffield MD   On: 01/16/2021 20:40    EKG: Independently reviewed. EKG shows sinus  bradycardia, LBBB. No acute ST elevation or depression. QTc is 486.   Assessment/Plan Principal Problem:   Orthostatic hypotension Ms. Giddens is admitted to medical telemetry floor.  IVF hydration provided in ER with bolus. Continue LR at 100 ml/hr overnight.  Recheck orthostatic BP in am after hydration.  Hold antihypertensive medications until BP stabilizes.  Placed on Fall precautions.  Consult PT in am.   Active Problems:   AKI (acute kidney injury)  IVF hydration.  Check renal function and electrolytes in am.     COPD (chronic obstructive pulmonary disease)  Breo Ellipta daily which is substituted for advair per hospital formulary.  Albuterol MDI as needed    Hypokalemia Replete potassium. Magnesium level is normal.  Recheck electrolytes in am.    Diabetes mellitus type 2 in nonobese  Continue home medication of Januvia    Diarrhea Pt with diarrhea over past week she reports. Recently treated with antibiotic as outpatient so will check for C. Difficle with diarrhea, dehydration and AKI. Will treat accordingly if positive.     Myelodysplastic syndrome  Chronic. Followed by oncology    Dehydration IVF hydration as above    Asymptomatic bacteruria Pt with no complaints of urinary frequency or dysuria. Was given dose of rocephin in ER. Urine culture obtained and will treat with antibiotic if culture positive.     DVT prophylaxis: Heparin for DVT prophylaxis with AKI and low GFR Code Status:   Full code  Family Communication:  Diagnosis and plan discussed with pt who verbalizes agreement with plan. Further recommendations to follow as clinically indicated. Disposition Plan:   Patient is from:  Home  Anticipated DC to:  Home vs Rehab. To be determined  Anticipated DC date:  Anticipate 2 midnight or longer stay to treat acute condition   Admission status:  Inpatient   Yevonne Aline Arafat Cocuzza MD [facility]  How to contact the [facility] Attending or Consulting provider  [place] or covering provider during after hours [place], for this patient?   1. Check the care team in [facility] and look for a) attending/consulting [facility] provider listed and b) the [facility] team listed 2. Log into [email or web address] and use [place]'s universal password to access. If you do not have the password, please contact the hospital operator. 3. Locate the [facility] provider you are looking for under [facility] and page to a number that you can be directly reached. 4. If you still have difficulty reaching the provider, please page the [facility] (Director on Call) for the Hospitalists listed on amion for assistance.  01/16/2021, 11:30 PM

## 2021-01-16 NOTE — ED Provider Notes (Signed)
[facility] EMERGENCY DEPARTMENT Provider Note   CSN: [id number] Arrival date & time: 01/16/21  1713     History Chief Complaint  Patient presents with  . Weakness  . Hypotension    Stefanie Phelps is a 87 y.o. female with a past medical history of myelodysplastic syndrome, anemia, COPD, DM, hypertension, reportedly Covid positive on 1/19 who presents today for evaluation of weakness, decreased appetite and multiple falls at home.  She does not take any blood thinning medications. When I asked patient why she is here she states "because I am as old as dirt."  When I ask her if she has fallen she tells me "stupid falls."  She reports pain in her face, left sided which she says was from when she fell two weeks ago.  Unable to give additional history.   Patient went to her PCP office, I am I unable to see those records today and was found to be hypotensive. EMS reports that her initial blood pressure was 64/42, she received 700 cc normal saline in route and BP improved to 112/58.  Level 5 caveat patient is very hard of hearing, unable to give clear answers to questions asked.  I attempted to contact family multiple times in the first hour patient was here without any successful contact.  HPI     Past Medical History:  Diagnosis Date  . Anemia   . Anemia associated with myeloproliferative disorder treated with erythropoietin ([place])   . Anemia associated with myeloproliferative disorder treated with erythropoietin ([place])   . Anemia, unspecified   . Aortic stenosis   . COPD (chronic obstructive pulmonary disease) ([place])   . Diabetes ([place])   . Hypertension   . Myelodysplastic disease ([place])   . Myelodysplastic syndrome ([place])   . Psoriasis     Patient Active Problem List   Diagnosis Date Noted  . AKI (acute kidney injury) ([place]) 01/16/2021  . Orthostatic hypotension 01/16/2021  . Hypokalemia 01/16/2021  . Diabetes mellitus type 2 in nonobese ([place]) 01/16/2021  .  Diarrhea 01/16/2021  . Dehydration 01/16/2021  . Refractory anemia with ringed sideroblasts ([place]) 09/07/2020  . Suspected COVID-19 virus infection 03/18/2019  . Atrial fibrillation with RVR ([place]) 03/18/2019  . Smoker 03/18/2019  . Psoriasis   . Myelodysplastic syndrome ([place])   . Hypertension   . Anemia   . COPD (chronic obstructive pulmonary disease) ([place])   . Aortic stenosis   . Diabetes [facility])     Past Surgical History:  Procedure Laterality Date  . ABDOMINAL HYSTERECTOMY    . CHOLECYSTECTOMY       OB History   No obstetric history on file.     Family History  Problem Relation Age of Onset  . Hypertension Mother   . Diabetes Mother   . Heart disease Mother   . Hypertension Father   . Diabetes Father   . Prostate cancer Father   . Lung cancer Brother   . Heart disease Brother     Social History   Tobacco Use  . Smoking status: Current Every Day Smoker  . Smokeless tobacco: Never Used  Substance Use Topics  . Alcohol use: No  . Drug use: No    Home Medications Prior to Admission medications   Medication Sig Start Date End Date Taking? Authorizing Provider  cephALEXin (KEFLEX) 500 MG capsule Take 500 mg by mouth 3 (three) times daily. 12/31/20  Yes [provider]  metoprolol succinate (TOPROL-XL) 50 MG 24 hr tablet  Take 1 tablet (50 mg total) by mouth daily for 30 days. 03/19/19 04/18/19 Yes Spongberg, Audie Pinto, MD  Adalimumab 40 MG/0.4ML PNKT Inject 40 mg into the skin every Sunday.     [provider]  allopurinol (ZYLOPRIM) 300 MG tablet Take 300 mg by mouth daily. 02/22/19   [provider]  CLOBETASOL PROPIONATE EMULSION EX Apply 1 application topically 2 (two) times daily as needed (legs).     [provider]  Cyanocobalamin 1000 MCG/ML KIT Inject 1,000 mcg as directed every 30 (thirty) days.    [provider]  enalapril (VASOTEC) 20 MG tablet Take 20 mg by mouth daily. 02/22/19   [provider]   Fluticasone-Salmeterol (ADVAIR) 250-50 MCG/DOSE AEPB Inhale 1 puff into the lungs 2 (two) times daily.    [provider]  furosemide (LASIX) 20 MG tablet Take 20 mg by mouth daily. 02/22/19   [provider]  hydrALAZINE (APRESOLINE) 25 MG tablet Take 25 mg by mouth 2 (two) times daily. 12/12/18   [provider]  HYDROcodone-acetaminophen (NORCO) 10-325 MG tablet Take 1 tablet by mouth 4 (four) times daily as needed.    [provider]  levofloxacin (LEVAQUIN) 500 MG tablet Take 500 mg by mouth daily.    [provider]  lovastatin (MEVACOR) 40 MG tablet Take 40 mg by mouth daily. 02/22/19   [provider]  luspatercept-aamt (REBLOZYL) 25 MG subcutaneous injection Inject 1 mg/kg into the skin once.    [provider]  mupirocin ointment (BACTROBAN) 2 % 1 application as needed.    [provider]  sitaGLIPtin (JANUVIA) 100 MG tablet Take 100 mg by mouth daily.    [provider]  TRIAMCINOLONE ACETONIDE, TOP, 0.05 % OINT Apply 1 application topically 2 (two) times daily as needed (legs).     [provider]  Vitamin D, Ergocalciferol, (DRISDOL) 50000 units CAPS capsule Take 50,000 Units by mouth every Sunday.     [provider]    Allergies    Patient has no known allergies.  Review of Systems   Review of Systems  Unable to perform ROS: Mental status change  Patient unable to give clear answers to questions.   Physical Exam Updated Vital Signs BP (!) 137/55   Pulse (!) 49   Temp (!) 97.4 F (36.3 C) (Oral)   Resp 14   SpO2 95%   Physical Exam Vitals and nursing note reviewed.  Constitutional:      Appearance: She is not diaphoretic.  HENT:     Head: Normocephalic.     Comments: Contusion present left side of face Eyes:     General: No scleral icterus.       Right eye: No discharge.        Left eye: No discharge.     Conjunctiva/sclera: Conjunctivae normal.  Cardiovascular:      Rate and Rhythm: Normal rate and regular rhythm.  Pulmonary:     Effort: Pulmonary effort is normal. No respiratory distress.     Breath sounds: No stridor. Rhonchi present.     Comments: While in room productive cough with clear sputum.  Abdominal:     General: There is no distension.     Tenderness: There is no abdominal tenderness. There is no guarding.  Musculoskeletal:        General: No deformity.     Cervical back: Normal range of motion.  Skin:    General: Skin is warm and dry.  Neurological:  Mental Status: She is alert.     Motor: No abnormal muscle tone.  Psychiatric:     Comments: Cooperative with repeat prompting     ED Results / Procedures / Treatments   Labs (all labs ordered are listed, but only abnormal results are displayed) Labs Reviewed  CBC WITH DIFFERENTIAL/PLATELET - Abnormal; Notable for the following components:      Result Value   RBC 3.17 (*)    Hemoglobin 10.0 (*)    HCT 32.2 (*)    MCV 101.6 (*)    RDW 18.7 (*)    All other components within normal limits  COMPREHENSIVE METABOLIC PANEL - Abnormal; Notable for the following components:   Potassium 2.9 (*)    CO2 19 (*)    Glucose, Bld 102 (*)    BUN 42 (*)    Creatinine, Ser 1.69 (*)    Calcium 8.7 (*)    Albumin 2.7 (*)    Total Bilirubin 1.3 (*)    GFR, Estimated 29 (*)    All other components within normal limits  URINALYSIS, ROUTINE W REFLEX MICROSCOPIC - Abnormal; Notable for the following components:   APPearance HAZY (*)    Hgb urine dipstick MODERATE (*)    Ketones, ur 5 (*)    Protein, ur 30 (*)    Leukocytes,Ua LARGE (*)    WBC, UA >50 (*)    Bacteria, UA MANY (*)    All other components within normal limits  TROPONIN I (HIGH SENSITIVITY) - Abnormal; Notable for the following components:   Troponin I (High Sensitivity) 30 (*)    All other components within normal limits  TROPONIN I (HIGH SENSITIVITY) - Abnormal; Notable for the following components:   Troponin I  (High Sensitivity) 31 (*)    All other components within normal limits  CULTURE, BLOOD (ROUTINE X 2)  CULTURE, BLOOD (ROUTINE X 2)  URINE CULTURE  C DIFFICILE QUICK SCREEN W PCR REFLEX  LACTIC ACID, PLASMA  LACTIC ACID, PLASMA  MAGNESIUM    EKG EKG Interpretation  Date/Time:  Friday January 16 2021 18:54:01 EST Ventricular Rate:  44 PR Interval:    QRS Duration: 144 QT Interval:  567 QTC Calculation: 486 R Axis:   -45 Text Interpretation: Sinus bradycardia Left bundle branch block No old tracing to compare Confirmed by Isla Pence [phone]) on 01/16/2021 8:14:26 PM   Radiology CT Head Wo Contrast  Result Date: 01/16/2021 CLINICAL DATA:  Head trauma. EXAM: CT HEAD WITHOUT CONTRAST CT MAXILLOFACIAL WITHOUT CONTRAST CT CERVICAL SPINE WITHOUT CONTRAST TECHNIQUE: Multidetector CT imaging of the head, cervical spine, and maxillofacial structures were performed using the standard protocol without intravenous contrast. Multiplanar CT image reconstructions of the cervical spine and maxillofacial structures were also generated. COMPARISON:  December 31, 2020. FINDINGS: CT HEAD FINDINGS Brain: No evidence of acute large vascular territory infarction, hemorrhage, hydrocephalus, extra-axial collection or mass lesion/mass effect. Similar patchy white matter hypoattenuation, likely relating to chronic microvascular ischemic disease. Vascular: Calcific atherosclerosis. Skull: No acute fracture. Other: No mastoid effusions. CT MAXILLOFACIAL FINDINGS Osseous: No fracture or mandibular dislocation. No destructive process. Orbits: Negative. No traumatic or inflammatory finding. Sinuses: Inferior left maxillary sinus in scattered ethmoid air cell mucosal thickening. Trace frothy secretions in the right sphenoid sinus. Soft tissues: Negative. CT CERVICAL SPINE FINDINGS Alignment: Similar alignment comparison to prior, including 2 mm of anterolisthesis of C4 on C5 and trace retrolisthesis of C5 on C6. Skull  base and vertebrae: No evidence acute fracture. Vertebral body heights  are maintained. Osteopenia. Soft tissues and spinal canal: No prevertebral fluid or swelling. No visible canal hematoma. Disc levels: Multilevel intervertebral degenerative change with disc height loss most pronounced at C5-C6 and C6-C7. Multilevel right greater than left facet hypertrophy. Upper chest: Visualized lung apices are clear. Other: Bilateral carotid atherosclerosis. IMPRESSION: CT head: 1. No evidence of acute intra cranial abnormality. 2. Similar chronic microvascular ischemic disease and generalized cerebral volume loss. CT maxillofacial: 1. No acute fracture. CT cervical spine: 1. No acute fracture or traumatic malalignment. 2. Similar multilevel degenerative disc disease and facet arthropathy. Electronically Signed   By: Margaretha Sheffield MD   On: 01/16/2021 20:40   CT Cervical Spine Wo Contrast  Result Date: 01/16/2021 CLINICAL DATA:  Head trauma. EXAM: CT HEAD WITHOUT CONTRAST CT MAXILLOFACIAL WITHOUT CONTRAST CT CERVICAL SPINE WITHOUT CONTRAST TECHNIQUE: Multidetector CT imaging of the head, cervical spine, and maxillofacial structures were performed using the standard protocol without intravenous contrast. Multiplanar CT image reconstructions of the cervical spine and maxillofacial structures were also generated. COMPARISON:  December 31, 2020. FINDINGS: CT HEAD FINDINGS Brain: No evidence of acute large vascular territory infarction, hemorrhage, hydrocephalus, extra-axial collection or mass lesion/mass effect. Similar patchy white matter hypoattenuation, likely relating to chronic microvascular ischemic disease. Vascular: Calcific atherosclerosis. Skull: No acute fracture. Other: No mastoid effusions. CT MAXILLOFACIAL FINDINGS Osseous: No fracture or mandibular dislocation. No destructive process. Orbits: Negative. No traumatic or inflammatory finding. Sinuses: Inferior left maxillary sinus in scattered ethmoid air cell  mucosal thickening. Trace frothy secretions in the right sphenoid sinus. Soft tissues: Negative. CT CERVICAL SPINE FINDINGS Alignment: Similar alignment comparison to prior, including 2 mm of anterolisthesis of C4 on C5 and trace retrolisthesis of C5 on C6. Skull base and vertebrae: No evidence acute fracture. Vertebral body heights are maintained. Osteopenia. Soft tissues and spinal canal: No prevertebral fluid or swelling. No visible canal hematoma. Disc levels: Multilevel intervertebral degenerative change with disc height loss most pronounced at C5-C6 and C6-C7. Multilevel right greater than left facet hypertrophy. Upper chest: Visualized lung apices are clear. Other: Bilateral carotid atherosclerosis. IMPRESSION: CT head: 1. No evidence of acute intra cranial abnormality. 2. Similar chronic microvascular ischemic disease and generalized cerebral volume loss. CT maxillofacial: 1. No acute fracture. CT cervical spine: 1. No acute fracture or traumatic malalignment. 2. Similar multilevel degenerative disc disease and facet arthropathy. Electronically Signed   By: Margaretha Sheffield MD   On: 01/16/2021 20:40   DG Chest Port 1 View  Result Date: 01/16/2021 CLINICAL DATA:  87 year old female with cough. EXAM: PORTABLE CHEST 1 VIEW COMPARISON:  Chest radiograph dated 12/31/2020 FINDINGS: No focal consolidation, pleural effusion, pneumothorax. The cardiac silhouette is within limits. Atherosclerotic calcification of the aorta. Osteopenia with degenerative changes of the spine. No acute osseous pathology. IMPRESSION: No active disease. Electronically Signed   By: Anner Crete M.D.   On: 01/16/2021 18:04   CT Maxillofacial WO CM  Result Date: 01/16/2021 CLINICAL DATA:  Head trauma. EXAM: CT HEAD WITHOUT CONTRAST CT MAXILLOFACIAL WITHOUT CONTRAST CT CERVICAL SPINE WITHOUT CONTRAST TECHNIQUE: Multidetector CT imaging of the head, cervical spine, and maxillofacial structures were performed using the standard  protocol without intravenous contrast. Multiplanar CT image reconstructions of the cervical spine and maxillofacial structures were also generated. COMPARISON:  December 31, 2020. FINDINGS: CT HEAD FINDINGS Brain: No evidence of acute large vascular territory infarction, hemorrhage, hydrocephalus, extra-axial collection or mass lesion/mass effect. Similar patchy white matter hypoattenuation, likely relating to chronic microvascular ischemic disease. Vascular: Calcific atherosclerosis.  Skull: No acute fracture. Other: No mastoid effusions. CT MAXILLOFACIAL FINDINGS Osseous: No fracture or mandibular dislocation. No destructive process. Orbits: Negative. No traumatic or inflammatory finding. Sinuses: Inferior left maxillary sinus in scattered ethmoid air cell mucosal thickening. Trace frothy secretions in the right sphenoid sinus. Soft tissues: Negative. CT CERVICAL SPINE FINDINGS Alignment: Similar alignment comparison to prior, including 2 mm of anterolisthesis of C4 on C5 and trace retrolisthesis of C5 on C6. Skull base and vertebrae: No evidence acute fracture. Vertebral body heights are maintained. Osteopenia. Soft tissues and spinal canal: No prevertebral fluid or swelling. No visible canal hematoma. Disc levels: Multilevel intervertebral degenerative change with disc height loss most pronounced at C5-C6 and C6-C7. Multilevel right greater than left facet hypertrophy. Upper chest: Visualized lung apices are clear. Other: Bilateral carotid atherosclerosis. IMPRESSION: CT head: 1. No evidence of acute intra cranial abnormality. 2. Similar chronic microvascular ischemic disease and generalized cerebral volume loss. CT maxillofacial: 1. No acute fracture. CT cervical spine: 1. No acute fracture or traumatic malalignment. 2. Similar multilevel degenerative disc disease and facet arthropathy. Electronically Signed   By: Margaretha Sheffield MD   On: 01/16/2021 20:40    Procedures Procedures   Medications Ordered in  ED Medications  lactated ringers bolus 1,000 mL (has no administration in time range)  cefTRIAXone (ROCEPHIN) 1 g in sodium chloride 0.9 % 100 mL IVPB (has no administration in time range)  potassium chloride (KLOR-CON) packet 40 mEq (40 mEq Oral Given 01/16/21 2005)    ED Course  I have reviewed the triage vital signs and the nursing notes.  Pertinent labs & imaging results that were available during my care of the patient were reviewed by me and considered in my medical decision making (see chart for details).  Clinical Course as of 01/16/21 2344  Ludwig Clarks Jan 16, 2021  1732 I attempted to contact family with no answer.   [EH]  1835 I asked RN to collect EKG as it has been ordered but not collected.   [EH]  1941 When I went to contact family again there was new contact information for grandson.  Patient states that her grandson had been with her earlier today.  Ernie Avena reports that when she fell about 2 weeks ago she went to [facility] emergency room and was Covid positive then.  Since then she has had significant decline, is having diarrhea that is reportedly sometimes just clear liquid multiple times a day every day and having episodes of fecal incontinence.  She has poor appetite, is not drinking well and has minimal p.o. intake.  He also states that she appears confused compared with baseline.  Prior to Covid she was able to ambulate with a walker however has had significant difficulty since.  Louie Casa grandson reports that she went to [place] and she was positive Half of a banana sandwich or yogurt, had  [EH]    Clinical Course User Index [EH] Ollen Gross   MDM Rules/Calculators/A&P                         Patient is an 87 year old woman who presents today for hypotension. She was reportedly seen at her PCPs office and was hypotensive. With EMS her blood pressure was in the 83J systolic. She was diagnosed with Covid on 1/19 and since then has had continuous frequent  diarrhea.   She was normotensive here after fluids, however noted to be bradycardic. Additionally on orthostatics she was significantly  orthostatic.  Given her falls CT head, face, neck were obtained without fracture or other acute abnormality.  CBC shows mild anemia which appears consistent with her baseline. CMP shows hypokalemia with a potassium of 2.9. This is orally repleted.  UA returned, appears infected, Blood and urine cultures had been ordered. Antibiotic ordered.   I spoke with Dr. Tonie Griffith who will see patient for admission.   Toshiye Marlita Keil was evaluated in Emergency Department on 01/16/2021 for the symptoms described in the history of present illness. She was evaluated in the context of the global COVID-19 pandemic, which necessitated consideration that the patient might be at risk for infection with the SARS-CoV-2 virus that causes COVID-19. Institutional protocols and algorithms that pertain to the evaluation of patients at risk for COVID-19 are in a state of rapid change based on information released by regulatory bodies including the CDC and federal and state organizations. These policies and algorithms were followed during the patient's care in the ED.  Note: Portions of this report may have been transcribed using voice recognition software. Every effort was made to ensure accuracy; however, inadvertent computerized transcription errors may be present.   Final Clinical Impression(s) / ED Diagnoses Final diagnoses:  COVID-19  Hypotension, unspecified hypotension type  Dehydration  Weakness  Hypokalemia  Acute cystitis with hematuria    Rx / DC Orders ED Discharge Orders    None       Ollen Gross 01/16/21 2345    Isla Pence, MD 01/16/21 2350

## 2021-01-16 NOTE — ED Notes (Signed)
[phone] cell or [phone] home Daughter

## 2021-01-17 ENCOUNTER — Inpatient Hospital Stay (HOSPITAL_COMMUNITY): Payer: Medicare PPO

## 2021-01-17 DIAGNOSIS — N179 Acute kidney failure, unspecified: Secondary | ICD-10-CM

## 2021-01-17 DIAGNOSIS — E119 Type 2 diabetes mellitus without complications: Secondary | ICD-10-CM

## 2021-01-17 DIAGNOSIS — I350 Nonrheumatic aortic (valve) stenosis: Secondary | ICD-10-CM

## 2021-01-17 DIAGNOSIS — J449 Chronic obstructive pulmonary disease, unspecified: Secondary | ICD-10-CM

## 2021-01-17 DIAGNOSIS — D461 Refractory anemia with ring sideroblasts: Secondary | ICD-10-CM | POA: Insufficient documentation

## 2021-01-17 DIAGNOSIS — R9431 Abnormal electrocardiogram [ECG] [EKG]: Secondary | ICD-10-CM

## 2021-01-17 DIAGNOSIS — R197 Diarrhea, unspecified: Secondary | ICD-10-CM

## 2021-01-17 DIAGNOSIS — Z66 Do not resuscitate: Secondary | ICD-10-CM

## 2021-01-17 DIAGNOSIS — R001 Bradycardia, unspecified: Secondary | ICD-10-CM

## 2021-01-17 DIAGNOSIS — D469 Myelodysplastic syndrome, unspecified: Secondary | ICD-10-CM

## 2021-01-17 DIAGNOSIS — I351 Nonrheumatic aortic (valve) insufficiency: Secondary | ICD-10-CM

## 2021-01-17 DIAGNOSIS — R531 Weakness: Secondary | ICD-10-CM

## 2021-01-17 DIAGNOSIS — R778 Other specified abnormalities of plasma proteins: Secondary | ICD-10-CM

## 2021-01-17 DIAGNOSIS — I480 Paroxysmal atrial fibrillation: Secondary | ICD-10-CM

## 2021-01-17 DIAGNOSIS — E860 Dehydration: Secondary | ICD-10-CM

## 2021-01-17 DIAGNOSIS — Z515 Encounter for palliative care: Secondary | ICD-10-CM

## 2021-01-17 DIAGNOSIS — Z7189 Other specified counseling: Secondary | ICD-10-CM

## 2021-01-17 DIAGNOSIS — R8271 Bacteriuria: Secondary | ICD-10-CM

## 2021-01-17 DIAGNOSIS — I5032 Chronic diastolic (congestive) heart failure: Secondary | ICD-10-CM

## 2021-01-17 DIAGNOSIS — E876 Hypokalemia: Secondary | ICD-10-CM

## 2021-01-17 DIAGNOSIS — I951 Orthostatic hypotension: Principal | ICD-10-CM

## 2021-01-17 LAB — BASIC METABOLIC PANEL
Anion gap: 13 (ref 5–15)
BUN: 38 mg/dL — ABNORMAL HIGH (ref 8–23)
CO2: 16 mmol/L — ABNORMAL LOW (ref 22–32)
Calcium: 8.7 mg/dL — ABNORMAL LOW (ref 8.9–10.3)
Chloride: 113 mmol/L — ABNORMAL HIGH (ref 98–111)
Creatinine, Ser: 1.46 mg/dL — ABNORMAL HIGH (ref 0.44–1.00)
GFR, Estimated: 34 mL/min — ABNORMAL LOW (ref >60)
Glucose, Bld: 80 mg/dL (ref 70–99)
Potassium: 3.8 mmol/L (ref 3.5–5.1)
Sodium: 142 mmol/L (ref 135–145)

## 2021-01-17 LAB — C DIFFICILE QUICK SCREEN W PCR REFLEX
C Diff antigen: NEGATIVE
C Diff interpretation: NOT DETECTED
C Diff toxin: NEGATIVE

## 2021-01-17 LAB — CBC
HCT: 30.9 % — ABNORMAL LOW (ref 36.0–46.0)
Hemoglobin: 9.5 g/dL — ABNORMAL LOW (ref 12.0–15.0)
MCH: 31.5 pg (ref 26.0–34.0)
MCHC: 30.7 g/dL (ref 30.0–36.0)
MCV: 102.3 fL — ABNORMAL HIGH (ref 80.0–100.0)
Platelets: 246 10*3/uL (ref 150–400)
RBC: 3.02 MIL/uL — ABNORMAL LOW (ref 3.87–5.11)
RDW: 18.8 % — ABNORMAL HIGH (ref 11.5–15.5)
WBC: 5.4 10*3/uL (ref 4.0–10.5)
nRBC: 0 % (ref 0.0–0.2)

## 2021-01-17 LAB — CORTISOL-AM, BLOOD: Cortisol - AM: 8.7 ug/dL (ref 6.7–22.6)

## 2021-01-17 LAB — GLUCOSE, CAPILLARY: Glucose-Capillary: 77 mg/dL (ref 70–99)

## 2021-01-17 LAB — SARS CORONAVIRUS 2 (TAT 6-24 HRS): SARS Coronavirus 2: NEGATIVE

## 2021-01-17 LAB — ECHOCARDIOGRAM COMPLETE
AR max vel: 0.72 cm2
AV Area VTI: 0.67 cm2
AV Area mean vel: 0.64 cm2
AV Mean grad: 16 mmHg
AV Peak grad: 29.9 mmHg
Ao pk vel: 2.74 m/s
Area-P 1/2: 2.09 cm2
P 1/2 time: 685 msec
S' Lateral: 2.9 cm

## 2021-01-17 LAB — HEMOGLOBIN A1C
Hgb A1c MFr Bld: 5.8 % — ABNORMAL HIGH (ref 4.8–5.6)
Mean Plasma Glucose: 119.76 mg/dL

## 2021-01-17 LAB — TSH: TSH: 0.358 u[IU]/mL (ref 0.350–4.500)

## 2021-01-17 MED ORDER — LACTATED RINGERS IV SOLN: INTRAVENOUS | Status: DC

## 2021-01-17 MED ORDER — LOPERAMIDE HCL 2 MG PO CAPS
2.0000 mg | ORAL_CAPSULE | ORAL | Status: DC | PRN
  Administered 2021-01-18: 2 mg via ORAL
  Filled 2021-01-17: qty 1

## 2021-01-17 MED ORDER — FLUTICASONE FUROATE-VILANTEROL 200-25 MCG/INH IN AEPB
1.0000 | INHALATION_SPRAY | Freq: Every day | RESPIRATORY_TRACT | Status: DC
  Administered 2021-01-17: 08:00:00 1 via RESPIRATORY_TRACT
  Filled 2021-01-17: qty 28

## 2021-01-17 MED ORDER — ACETAMINOPHEN 650 MG RE SUPP: 650.0000 mg | Freq: Four times a day (QID) | RECTAL | Status: DC | PRN

## 2021-01-17 MED ORDER — HYDROCODONE-ACETAMINOPHEN 10-325 MG PO TABS: 1.0000 | ORAL_TABLET | Freq: Four times a day (QID) | ORAL | Status: DC | PRN

## 2021-01-17 MED ORDER — ALLOPURINOL 100 MG PO TABS
100.0000 mg | ORAL_TABLET | Freq: Every day | ORAL | Status: DC
  Administered 2021-01-17 – 2021-01-19 (×3): 100 mg via ORAL
  Filled 2021-01-17 (×3): qty 1

## 2021-01-17 MED ORDER — ACETAMINOPHEN 325 MG PO TABS: 650.0000 mg | ORAL_TABLET | Freq: Four times a day (QID) | ORAL | Status: DC | PRN

## 2021-01-17 MED ORDER — INSULIN ASPART 100 UNIT/ML ~~LOC~~ SOLN: 0.0000 [IU] | Freq: Every day | SUBCUTANEOUS | Status: DC

## 2021-01-17 MED ORDER — LINAGLIPTIN 5 MG PO TABS
5.0000 mg | ORAL_TABLET | Freq: Every day | ORAL | Status: DC
  Administered 2021-01-17 – 2021-01-19 (×3): 5 mg via ORAL
  Filled 2021-01-17 (×3): qty 1

## 2021-01-17 MED ORDER — HYDROXYZINE HCL 25 MG PO TABS: 25.0000 mg | ORAL_TABLET | Freq: Every evening | ORAL | Status: DC | PRN

## 2021-01-17 MED ORDER — HEPARIN SODIUM (PORCINE) 5000 UNIT/ML IJ SOLN
5000.0000 [IU] | Freq: Three times a day (TID) | INTRAMUSCULAR | Status: DC
  Administered 2021-01-17 – 2021-01-19 (×8): 5000 [IU] via SUBCUTANEOUS
  Filled 2021-01-17 (×7): qty 1

## 2021-01-17 MED ORDER — INSULIN ASPART 100 UNIT/ML ~~LOC~~ SOLN
0.0000 [IU] | Freq: Three times a day (TID) | SUBCUTANEOUS | Status: DC
  Administered 2021-01-18: 1 [IU] via SUBCUTANEOUS

## 2021-01-17 MED ORDER — POTASSIUM CHLORIDE 10 MEQ/100ML IV SOLN
10.0000 meq | INTRAVENOUS | Status: DC
  Administered 2021-01-17 (×2): 10 meq via INTRAVENOUS
  Filled 2021-01-17 (×2): qty 100

## 2021-01-17 NOTE — ED Notes (Signed)
bs is 109

## 2021-01-17 NOTE — ED Notes (Signed)
ECHO at bedside.

## 2021-01-17 NOTE — ED Notes (Signed)
CBG reads [place] made aware

## 2021-01-17 NOTE — Progress Notes (Addendum)
PROGRESS NOTE  Stefanie Phelps [id number] DOB: 1930-08-28   PCP: Penelope Coop, FNP  Patient is from: Home.  Lives alone.  Uses walker at baseline.  DOA: 01/16/2021 LOS: 1  Chief complaints: Weakness, fall and diarrhea  Brief Narrative / Interim history: 87 year old female with PMH of MDS, COPD, DM-2, HTN, anemia, debility (walker dependent) and recent COVID-19 infection sent to ED from PCP office due to hypotension. Recently treated for UTI by PCP. She had weakness, poor p.o. intake, multiple falls and diarrhea for about 1 to 2 weeks.  She presented to PCP for evaluation and found to be hypotensive to 64/42, and sent to ED by EMS.   Admitted for orthostatic hypotension, AKI,  Hypokalemia and diarrhea.  Orthostatic vitals positive for admission.  C. difficile negative.  COVID-19 PCR negative.  UA with large LE and many bacteria but no UTI symptoms.  Started on IV fluid with improvement in his symptoms.  Subjective: Seen and examined earlier this morning.  No major events overnight or this morning.  She says she feels better but could not be specific about what symptoms are better.  She says she is still have diarrhea that she describes as loose stool.  She denies chest pain, shortness of breath, headache, vertigo, vision change, focal neuro symptoms, nausea, vomiting or UTI symptoms.  Unsure about lightheadedness since she has not gotten up yet.  Objective: Vitals:   01/17/21 1000 01/17/21 1015 01/17/21 1030 01/17/21 1045  BP: (!) 127/46 (!) 134/48 (!) 129/55 (!) 119/48  Pulse: (!) 49 (!) 51 (!) 51 (!) 50  Resp: 16 17 14 14   Temp:      TempSrc:      SpO2: 95% 96% 95% 97%    Intake/Output Summary (Last 24 hours) at 01/17/2021 1104 Last data filed at 01/17/2021 0814 Gross per 24 hour  Intake 100 ml  Output 1 ml  Net 99 ml   There were no vitals filed for this visit.  Examination:  GENERAL: No apparent distress.  Nontoxic. HEENT: MMM.  Vision grossly intact.  Diminished hearing.   Bruising over left face. NECK: Supple.  No apparent JVD.  RESP:  No IWOB.  Fair aeration bilaterally. CVS: HR in 56s.  Regular rhythm.  3/6 RUSB.  ABD/GI/GU: BS+. Abd soft, NTND.  MSK/EXT:  Moves extremities. No apparent deformity. No edema.  SKIN: Bruising over left face and right knee. NEURO: Awake, alert and oriented appropriately.  No apparent focal neuro deficit. PSYCH: Calm. Normal affect.   Procedures:  None  Microbiology summarized: COVID-19 PCR negative. Blood cultures NGTD. C. difficile negative. Urine culture pending.  Assessment & Plan: Orthostatic hypotension: Likely in the setting of poor p.o. intake, diarrhea, recent COVID-19 infection and concurrent use of diuretics and antihypertensive meds.  She is also bradycardic.  Twelve-lead EKG reviewed by me shows sinus bradycardia to 44 with possible LBBB which appears to be similar to her prior EKG in 2018.  She has mild elevated troponin to 30>31 which is not consistent with ACS. Last TTE with diastolic dysfunction and moderate to severe aortic stenosis.  Cortisol slightly low.  -Continue IV fluid -Continue holding home antihypertensive meds. -Orthostatic vitals -Check TTE  Sinus bradycardia/abnormal EKG/elevated troponin-EKG as above.  Troponin elevation likely demand ischemia and delayed clearance.  She has no chest pain.  Bradycardia likely due to metoprolol -Hold home metoprolol. -TTE as above  Paroxysmal A. fib: Rate controlled.  On metoprolol at home.  Not on anticoagulation. -Holding home metoprolol in  the setting of bradycardia  Moderate to severe aortic stenosis -Update TTE  Chronic diastolic CHF: TTE in 03/980 with LVEF of 55 to 19%, diastolic dysfunction and moderate to severe aortic stenosis.  On Lasix at home.  Currently hypovolemic. -Update TTE -Monitor fluid status -Hold home diuretics and metoprolol  Dehydration/diarrhea: Abdominal exam benign.  C. difficile negative. -Continue IV fluid -Start  Imodium  AKI on CKD-3A-likely prerenal.  Improving. Recent Labs    10/08/20 0000 10/30/20 0000 11/20/20 0000 01/16/21 1746 01/17/21 0435  BUN 20 15 20  42* 38*  CREATININE 1.3* 1.2* 1.3* 1.69* 1.46*  -Continue holding diuretics -Continue IV fluid -Recheck in the morning  Non-anion gap metabolic acidosis: Likely due to IV fluid and renal failure. -Continue monitoring  Hypokalemia: Resolved.  Chronic COPD: Stable. -Continue home breathing treatments  NIDDM-2: On Januvia at home. -Continue home Januvia -Start CBG monitoring and SSI -Check hemoglobin A1c  History of MDS-followed by Dr. Bobby Rumpf at [facility] -Outpatient follow-up  Recent COVID-19 infection-no respiratory symptoms. -No airborne isolation indicated.  Asymptomatic bacteriuria-recently treated for UTI.  No UTI symptoms. Received a dose of CTX. -No indication for antibiotics  Debility/generalized weakness/recurrent fall: Cranial, cervical and maxillofacial CT without significant finding. -PT/OT  There is no height or weight on file to calculate BMI.         DVT prophylaxis:  heparin injection 5,000 Units Start: 01/17/21 0600  Code Status: DNR/DNI Family Communication: Updated patient's daughter over the phone Level of care: Telemetry Medical Status is: Inpatient  Remains inpatient appropriate because:Hemodynamically unstable, Persistent severe electrolyte disturbances, Ongoing diagnostic testing needed not appropriate for outpatient work up, Unsafe d/c plan, IV treatments appropriate due to intensity of illness or inability to take PO and Inpatient level of care appropriate due to severity of illness   Dispo: The patient is from: Home              Anticipated d/c is to: To be determined              Anticipated d/c date is: 2 days              Patient currently is not medically stable to d/c.   Difficult to place patient No       Consultants:  None   Sch Meds:  Scheduled Meds: . fluticasone  furoate-vilanterol  1 puff Inhalation Daily  . heparin injection (subcutaneous)  5,000 Units Subcutaneous Q8H  . linagliptin  5 mg Oral Daily   Continuous Infusions: . lactated ringers 75 mL/hr at 01/17/21 0430   PRN Meds:.acetaminophen **OR** acetaminophen  Antimicrobials: Anti-infectives (From admission, onward)   Start     Dose/Rate Route Frequency Ordered Stop   01/16/21 2330  cefTRIAXone (ROCEPHIN) 1 g in sodium chloride 0.9 % 100 mL IVPB        1 g 200 mL/hr over 30 Minutes Intravenous  Once 01/16/21 2317 01/17/21 0059       I have personally reviewed the following labs and images: CBC: Recent Labs  Lab 01/16/21 1746 01/17/21 0435  WBC 5.3 5.4  NEUTROABS 2.7  --   HGB 10.0* 9.5*  HCT 32.2* 30.9*  MCV 101.6* 102.3*  PLT 288 246   BMP &GFR Recent Labs  Lab 01/16/21 1746 01/16/21 2036 01/17/21 0435  NA 139  --  142  K 2.9*  --  3.8  CL 109  --  113*  CO2 19*  --  16*  GLUCOSE 102*  --  80  BUN 42*  --  38*  CREATININE 1.69*  --  1.46*  CALCIUM 8.7*  --  8.7*  MG  --  2.1  --    CrCl cannot be calculated (Unknown ideal weight.). Liver & Pancreas: Recent Labs  Lab 01/16/21 1746  AST 24  ALT 13  ALKPHOS 44  BILITOT 1.3*  PROT 6.5  ALBUMIN 2.7*   No results for input(s): LIPASE, AMYLASE in the last 168 hours. No results for input(s): AMMONIA in the last 168 hours. Diabetic: No results for input(s): HGBA1C in the last 72 hours. No results for input(s): GLUCAP in the last 168 hours. Cardiac Enzymes: No results for input(s): CKTOTAL, CKMB, CKMBINDEX, TROPONINI in the last 168 hours. No results for input(s): PROBNP in the last 8760 hours. Coagulation Profile: No results for input(s): INR, PROTIME in the last 168 hours. Thyroid Function Tests: Recent Labs    01/17/21 0800  TSH 0.358   Lipid Profile: No results for input(s): CHOL, HDL, LDLCALC, TRIG, CHOLHDL, LDLDIRECT in the last 72 hours. Anemia Panel: No results for input(s): VITAMINB12,  FOLATE, FERRITIN, TIBC, IRON, RETICCTPCT in the last 72 hours. Urine analysis:    Component Value Date/Time   COLORURINE YELLOW 01/16/2021 2254   APPEARANCEUR HAZY (A) 01/16/2021 2254   LABSPEC 1.017 01/16/2021 2254   PHURINE 5.0 01/16/2021 2254   GLUCOSEU NEGATIVE 01/16/2021 2254   HGBUR MODERATE (A) 01/16/2021 2254   BILIRUBINUR NEGATIVE 01/16/2021 2254   KETONESUR 5 (A) 01/16/2021 2254   PROTEINUR 30 (A) 01/16/2021 2254   NITRITE NEGATIVE 01/16/2021 2254   LEUKOCYTESUR LARGE (A) 01/16/2021 2254   Sepsis Labs: Invalid input(s): PROCALCITONIN, [place]  Microbiology: Recent Results (from the past 240 hour(s))  Culture, blood (routine x 2)     Status: None (Preliminary result)   Collection Time: 01/16/21  8:08 PM   Specimen: BLOOD RIGHT ARM  Result Value Ref Range Status   Specimen Description BLOOD RIGHT ARM  Final   Special Requests   Final    BOTTLES DRAWN AEROBIC AND ANAEROBIC Blood Culture adequate volume   Culture   Final    NO GROWTH < 12 HOURS Performed at [facility], [address] [place], [place] [address]    Report Status PENDING  Incomplete  SARS CORONAVIRUS 2 (TAT 6-24 HRS) Nasopharyngeal Nasopharyngeal Swab     Status: None   Collection Time: 01/17/21 12:23 AM   Specimen: Nasopharyngeal Swab  Result Value Ref Range Status   SARS Coronavirus 2 NEGATIVE NEGATIVE Final    Comment: (NOTE) SARS-CoV-2 target nucleic acids are NOT DETECTED.  The SARS-CoV-2 RNA is generally detectable in upper and lower respiratory specimens during the acute phase of infection. Negative results do not preclude SARS-CoV-2 infection, do not rule out co-infections with other pathogens, and should not be used as the sole basis for treatment or other patient management decisions. Negative results must be combined with clinical observations, patient history, and epidemiological information. The expected result is Negative.  Fact Sheet for  Patients: SugarRoll.be  Fact Sheet for Healthcare Providers: https://www.woods-mathews.com/  This test is not yet approved or cleared by the [place] FDA and  has been authorized for detection and/or diagnosis of SARS-CoV-2 by FDA under an Emergency Use Authorization (EUA). This EUA will remain  in effect (meaning this test can be used) for the duration of the COVID-19 declaration under Se ction 564(b)(1) of the Act, 21 U.S.C. section 360bbb-3(b)(1), unless the authorization is terminated or revoked sooner.  Performed at [facility],  [address] [place], [place] [address]   C Difficile Quick Screen w PCR reflex     Status: None   Collection Time: 01/17/21  8:04 AM   Specimen: STOOL  Result Value Ref Range Status   C Diff antigen NEGATIVE NEGATIVE Final   C Diff toxin NEGATIVE NEGATIVE Final   C Diff interpretation No C. difficile detected.  Final    Comment: Performed at [facility], [place] [address], [place], [place] [address]    Radiology Studies: CT Head Wo Contrast  Result Date: 01/16/2021 CLINICAL DATA:  Head trauma. EXAM: CT HEAD WITHOUT CONTRAST CT MAXILLOFACIAL WITHOUT CONTRAST CT CERVICAL SPINE WITHOUT CONTRAST TECHNIQUE: Multidetector CT imaging of the head, cervical spine, and maxillofacial structures were performed using the standard protocol without intravenous contrast. Multiplanar CT image reconstructions of the cervical spine and maxillofacial structures were also generated. COMPARISON:  December 31, 2020. FINDINGS: CT HEAD FINDINGS Brain: No evidence of acute large vascular territory infarction, hemorrhage, hydrocephalus, extra-axial collection or mass lesion/mass effect. Similar patchy white matter hypoattenuation, likely relating to chronic microvascular ischemic disease. Vascular: Calcific atherosclerosis. Skull: No acute fracture. Other: No mastoid effusions. CT MAXILLOFACIAL FINDINGS Osseous: No fracture or  mandibular dislocation. No destructive process. Orbits: Negative. No traumatic or inflammatory finding. Sinuses: Inferior left maxillary sinus in scattered ethmoid air cell mucosal thickening. Trace frothy secretions in the right sphenoid sinus. Soft tissues: Negative. CT CERVICAL SPINE FINDINGS Alignment: Similar alignment comparison to prior, including 2 mm of anterolisthesis of C4 on C5 and trace retrolisthesis of C5 on C6. Skull base and vertebrae: No evidence acute fracture. Vertebral body heights are maintained. Osteopenia. Soft tissues and spinal canal: No prevertebral fluid or swelling. No visible canal hematoma. Disc levels: Multilevel intervertebral degenerative change with disc height loss most pronounced at C5-C6 and C6-C7. Multilevel right greater than left facet hypertrophy. Upper chest: Visualized lung apices are clear. Other: Bilateral carotid atherosclerosis. IMPRESSION: CT head: 1. No evidence of acute intra cranial abnormality. 2. Similar chronic microvascular ischemic disease and generalized cerebral volume loss. CT maxillofacial: 1. No acute fracture. CT cervical spine: 1. No acute fracture or traumatic malalignment. 2. Similar multilevel degenerative disc disease and facet arthropathy. Electronically Signed   By: Margaretha Sheffield MD   On: 01/16/2021 20:40   CT Cervical Spine Wo Contrast  Result Date: 01/16/2021 CLINICAL DATA:  Head trauma. EXAM: CT HEAD WITHOUT CONTRAST CT MAXILLOFACIAL WITHOUT CONTRAST CT CERVICAL SPINE WITHOUT CONTRAST TECHNIQUE: Multidetector CT imaging of the head, cervical spine, and maxillofacial structures were performed using the standard protocol without intravenous contrast. Multiplanar CT image reconstructions of the cervical spine and maxillofacial structures were also generated. COMPARISON:  December 31, 2020. FINDINGS: CT HEAD FINDINGS Brain: No evidence of acute large vascular territory infarction, hemorrhage, hydrocephalus, extra-axial collection or mass  lesion/mass effect. Similar patchy white matter hypoattenuation, likely relating to chronic microvascular ischemic disease. Vascular: Calcific atherosclerosis. Skull: No acute fracture. Other: No mastoid effusions. CT MAXILLOFACIAL FINDINGS Osseous: No fracture or mandibular dislocation. No destructive process. Orbits: Negative. No traumatic or inflammatory finding. Sinuses: Inferior left maxillary sinus in scattered ethmoid air cell mucosal thickening. Trace frothy secretions in the right sphenoid sinus. Soft tissues: Negative. CT CERVICAL SPINE FINDINGS Alignment: Similar alignment comparison to prior, including 2 mm of anterolisthesis of C4 on C5 and trace retrolisthesis of C5 on C6. Skull base and vertebrae: No evidence acute fracture. Vertebral body heights are maintained. Osteopenia. Soft tissues and spinal canal: No prevertebral fluid or swelling. No  visible canal hematoma. Disc levels: Multilevel intervertebral degenerative change with disc height loss most pronounced at C5-C6 and C6-C7. Multilevel right greater than left facet hypertrophy. Upper chest: Visualized lung apices are clear. Other: Bilateral carotid atherosclerosis. IMPRESSION: CT head: 1. No evidence of acute intra cranial abnormality. 2. Similar chronic microvascular ischemic disease and generalized cerebral volume loss. CT maxillofacial: 1. No acute fracture. CT cervical spine: 1. No acute fracture or traumatic malalignment. 2. Similar multilevel degenerative disc disease and facet arthropathy. Electronically Signed   By: Margaretha Sheffield MD   On: 01/16/2021 20:40   DG Chest Port 1 View  Result Date: 01/16/2021 CLINICAL DATA:  87 year old female with cough. EXAM: PORTABLE CHEST 1 VIEW COMPARISON:  Chest radiograph dated 12/31/2020 FINDINGS: No focal consolidation, pleural effusion, pneumothorax. The cardiac silhouette is within limits. Atherosclerotic calcification of the aorta. Osteopenia with degenerative changes of the spine. No acute  osseous pathology. IMPRESSION: No active disease. Electronically Signed   By: Anner Crete M.D.   On: 01/16/2021 18:04   CT Maxillofacial WO CM  Result Date: 01/16/2021 CLINICAL DATA:  Head trauma. EXAM: CT HEAD WITHOUT CONTRAST CT MAXILLOFACIAL WITHOUT CONTRAST CT CERVICAL SPINE WITHOUT CONTRAST TECHNIQUE: Multidetector CT imaging of the head, cervical spine, and maxillofacial structures were performed using the standard protocol without intravenous contrast. Multiplanar CT image reconstructions of the cervical spine and maxillofacial structures were also generated. COMPARISON:  December 31, 2020. FINDINGS: CT HEAD FINDINGS Brain: No evidence of acute large vascular territory infarction, hemorrhage, hydrocephalus, extra-axial collection or mass lesion/mass effect. Similar patchy white matter hypoattenuation, likely relating to chronic microvascular ischemic disease. Vascular: Calcific atherosclerosis. Skull: No acute fracture. Other: No mastoid effusions. CT MAXILLOFACIAL FINDINGS Osseous: No fracture or mandibular dislocation. No destructive process. Orbits: Negative. No traumatic or inflammatory finding. Sinuses: Inferior left maxillary sinus in scattered ethmoid air cell mucosal thickening. Trace frothy secretions in the right sphenoid sinus. Soft tissues: Negative. CT CERVICAL SPINE FINDINGS Alignment: Similar alignment comparison to prior, including 2 mm of anterolisthesis of C4 on C5 and trace retrolisthesis of C5 on C6. Skull base and vertebrae: No evidence acute fracture. Vertebral body heights are maintained. Osteopenia. Soft tissues and spinal canal: No prevertebral fluid or swelling. No visible canal hematoma. Disc levels: Multilevel intervertebral degenerative change with disc height loss most pronounced at C5-C6 and C6-C7. Multilevel right greater than left facet hypertrophy. Upper chest: Visualized lung apices are clear. Other: Bilateral carotid atherosclerosis. IMPRESSION: CT head: 1. No  evidence of acute intra cranial abnormality. 2. Similar chronic microvascular ischemic disease and generalized cerebral volume loss. CT maxillofacial: 1. No acute fracture. CT cervical spine: 1. No acute fracture or traumatic malalignment. 2. Similar multilevel degenerative disc disease and facet arthropathy. Electronically Signed   By: Margaretha Sheffield MD   On: 01/16/2021 20:40      Artina Minella [address]. [place]  If 7PM-7AM, please contact night-coverage [email or web address] 01/17/2021, 11:04 AM

## 2021-01-17 NOTE — Progress Notes (Signed)
  Echocardiogram 2D Echocardiogram has been performed.  Stefanie Phelps 01/17/2021, 1:12 PM

## 2021-01-17 NOTE — ED Notes (Signed)
Pt is eating at this time.

## 2021-01-17 NOTE — Consult Note (Signed)
Consultation Note Date: 01/17/2021   Patient Name: Stefanie Phelps  DOB: December 04, 1930  MRN: [id number]  Age / Sex: 87 y.o., female  PCP: Stefanie Councilman, FNP Referring Physician: Almon Hercules, MD  Reason for Consultation: Establishing goals of care  HPI/Patient Profile: 87 y.o. female  with past medical history of myelodysplastic syndrome, anemia, COPD, DM, and hypertension. She presented to the emergency department on 01/16/2021 with weakness, decreased appetite, and multiple falls at home. She went to her PCP office 2/4, was found to be hypotensive, and sent to hospital by EMS.  ED Course: BP improved with IV fluid but still orthostatic with SBP dropping from 130 to 70 with standing. Also found to have creatinine 1.69.  Admitted to [facility] for further management of orthostatic hypotension and AKI.   Clinical Assessment and Goals of Care: I have reviewed medical records including EPIC notes, labs and imaging, and met at bedside with patient to discuss diagnosis, prognosis, GOC, EOL wishes, disposition, and options. I also spoke with grandson Stefanie Phelps by phone.  I introduced Palliative Medicine as specialized medical care for people living with serious illness. It focuses on providing relief from the symptoms and stress of a serious illness.   We discussed a brief life review of the patient. She is from [place] and still lives there. She retired from work at [employer]. Her husband passed in 1996. She has 3 daughters: Stefanie Phelps, and Stefanie Phelps.  Patient currently lives alone, but her grandson Stefanie Phelps lives next door and is her primary caregiver. As far as baseline functional status, patient is ambulatory with a walker. She is able to dress and (sponge) bathe herself. She can prepare simple meals for herself. Patient reports she "does pretty well", however Stefanie Phelps reports she has declined ober the past couple weeks.    We discussed her current illness and what it means in the larger context of her ongoing co-morbidities.  Natural disease trajectory of chronic illness was discussed. Also discussed that myelodysplastic syndrome is not curable and eventually terminal. Patient and her grandson both verbalize understanding of this. Patient shares she hasn't required a transfusion in about 2 months.   I attempted to elicit values and goals of care important to the patient. Both patient and grandson are adamant that she will not ever be in a nursing facility. She greatly values her independence.    We did discuss code status. Encouraged patient to consider DNR/DNI status understanding evidenced based poor outcomes in similar hospitalized patients, as the cause of the arrest is likely associated with chronic/terminal disease rather than a reversible acute cardio-pulmonary event. I expressed a recommendation for having a DNR order in place, to which patient agrees.   Hospice and Palliative Care services outpatient were explained and offered. Discussed that hospice could support her goal of remaining at home as well as offer improved quality of life and symptom management. Stefanie Phelps indicates he is open and receptive to hospice services at some point.   Questions and concerns were addressed.  Stefanie Phelps was provided with PMT contact information and was encouraged to call with questions or concerns.   Primary decision maker: Patient  Patient states her surrogate decision maker would be grandson Stefanie Phelps [phone]    SUMMARY OF RECOMMENDATIONS    Code status changed to DNR/DNI per patient's wishes  Continue current medical care  Goal of care is for patient to return home  Would recommend outpatient palliative care to follow at discharge  PMT will continue to follow  Code Status/Advance Care Planning:  DNR  Symptom Management:   Per primary team  Palliative Prophylaxis:   Falls precautions  Additional  Recommendations (Limitations, Scope, Preferences):  Full Scope Treatment  Psycho-social/Spiritual:   Created space and opportunity for patient and family to express thoughts and feelings regarding patient's current medical situation.   Emotional support provided   Prognosis:   Unable to determine  Discharge Planning: likely home with home health       Primary Diagnoses: Present on Admission: . COPD (chronic obstructive pulmonary disease) (HCC) . Myelodysplastic syndrome (HCC)   I have reviewed the medical record, interviewed the patient and family, and examined the patient. The following aspects are pertinent.  Past Medical History:  Diagnosis Date  . Anemia   . Anemia associated with myeloproliferative disorder treated with erythropoietin (HCC)   . Anemia associated with myeloproliferative disorder treated with erythropoietin (HCC)   . Anemia, unspecified   . Aortic stenosis   . COPD (chronic obstructive pulmonary disease) (HCC)   . Diabetes (HCC)   . Hypertension   . Myelodysplastic disease (HCC)   . Myelodysplastic syndrome (HCC)   . Psoriasis     Scheduled Meds: . allopurinol  100 mg Oral Daily  . fluticasone furoate-vilanterol  1 puff Inhalation Daily  . heparin injection (subcutaneous)  5,000 Units Subcutaneous Q8H  . insulin aspart  0-5 Units Subcutaneous QHS  . insulin aspart  0-9 Units Subcutaneous TID WC  . linagliptin  5 mg Oral Daily   Continuous Infusions: . lactated ringers 75 mL/hr at 01/17/21 0430   PRN Meds:.acetaminophen **OR** acetaminophen, HYDROcodone-acetaminophen, hydrOXYzine, loperamide Medications Prior to Admission:  Prior to Admission medications   Medication Sig Start Date End Date Taking? Authorizing Provider  Adalimumab 40 MG/0.4ML PNKT Inject 40 mg into the skin every Sunday.    Yes [provider]  allopurinol (ZYLOPRIM) 300 MG tablet Take 300 mg by mouth daily. 02/22/19  Yes [provider]  CLOBETASOL  PROPIONATE EMULSION EX Apply 1 application topically 2 (two) times daily as needed (legs).    Yes [provider]  Cyanocobalamin 1000 MCG/ML KIT Inject 1,000 mcg as directed every 30 (thirty) days.   Yes [provider]  enalapril (VASOTEC) 20 MG tablet Take 20 mg by mouth daily. 02/22/19  Yes [provider]  Fluticasone-Salmeterol (ADVAIR) 250-50 MCG/DOSE AEPB Inhale 1 puff into the lungs 2 (two) times daily.   Yes [provider]  furosemide (LASIX) 20 MG tablet Take 20 mg by mouth daily. 02/22/19  Yes [provider]  hydrALAZINE (APRESOLINE) 25 MG tablet Take 25 mg by mouth 2 (two) times daily. 12/12/18  Yes [provider]  HYDROcodone-acetaminophen (NORCO) 10-325 MG tablet Take 1 tablet by mouth 4 (four) times daily as needed for moderate pain.   Yes [provider]  hydrOXYzine (ATARAX/VISTARIL) 25 MG tablet Take 25 mg by mouth at bedtime as needed for anxiety (sleeping). 10/06/20  Yes [provider]  lovastatin (MEVACOR) 40 MG tablet Take 40  mg by mouth daily. 02/22/19  Yes [provider]  metoprolol succinate (TOPROL-XL) 50 MG 24 hr tablet Take 1 tablet (50 mg total) by mouth daily for 30 days. 03/19/19 04/18/19 Yes Spongberg, Susy Frizzle, MD  sitaGLIPtin (JANUVIA) 100 MG tablet Take 100 mg by mouth daily.   Yes [provider]  TRIAMCINOLONE ACETONIDE, TOP, 0.05 % OINT Apply 1 application topically 2 (two) times daily as needed (legs).    Yes [provider]  Vitamin D, Ergocalciferol, (DRISDOL) 50000 units CAPS capsule Take 50,000 Units by mouth every Sunday.    Yes [provider]   No Known Allergies Review of Systems  Neurological: Positive for weakness.    Physical Exam Vitals reviewed.  Constitutional:      General: She is not in acute distress.    Comments: Chronically ill-appearing  Cardiovascular:     Rate and Rhythm: Bradycardia present.  Pulmonary:     Effort:  Pulmonary effort is normal.  Neurological:     Mental Status: She is alert and oriented to person, place, and time.     Vital Signs: BP (!) 136/56   Pulse (!) 54   Temp (!) 97.4 F (36.3 C) (Oral)   Resp 15   SpO2 93%      Pain Score: 0-No pain   SpO2: SpO2: 93 % O2 Device:SpO2: 93 % O2 Flow Rate: .   IO: Intake/output summary:   Intake/Output Summary (Last 24 hours) at 01/17/2021 1818 Last data filed at 01/17/2021 1324 Gross per 24 hour  Intake 100 ml  Output 1 ml  Net 99 ml         Palliative Assessment/Data: PPS 40%     Time In: 18:15 Time Out: 19:28 Time Total: 73 minutes Greater than 50%  of this time was spent counseling and coordinating care related to the above assessment and plan.  Signed by: Merry Proud, NP   Please contact Palliative Medicine Team phone at [phone] for questions and concerns.  For individual provider: See Loretha Stapler

## 2021-01-17 NOTE — ED Notes (Addendum)
CBG collected. Result "42." RN, Callie S., notified.

## 2021-01-17 NOTE — ED Notes (Signed)
Pt blood sugar 61. Pt is drinking OJ at this time. Will recheck blood sugar soon

## 2021-01-18 ENCOUNTER — Encounter (HOSPITAL_COMMUNITY): Payer: Self-pay | Admitting: Family Medicine

## 2021-01-18 LAB — RENAL FUNCTION PANEL
Albumin: 2.2 g/dL — ABNORMAL LOW (ref 3.5–5.0)
Anion gap: 8 (ref 5–15)
BUN: 25 mg/dL — ABNORMAL HIGH (ref 8–23)
CO2: 20 mmol/L — ABNORMAL LOW (ref 22–32)
Calcium: 8.7 mg/dL — ABNORMAL LOW (ref 8.9–10.3)
Chloride: 114 mmol/L — ABNORMAL HIGH (ref 98–111)
Creatinine, Ser: 1.13 mg/dL — ABNORMAL HIGH (ref 0.44–1.00)
GFR, Estimated: 47 mL/min — ABNORMAL LOW (ref >60)
Glucose, Bld: 89 mg/dL (ref 70–99)
Phosphorus: 1.3 mg/dL — ABNORMAL LOW (ref 2.5–4.6)
Potassium: 3.4 mmol/L — ABNORMAL LOW (ref 3.5–5.1)
Sodium: 142 mmol/L (ref 135–145)

## 2021-01-18 LAB — GLUCOSE, CAPILLARY
Glucose-Capillary: 84 mg/dL (ref 70–99)
Glucose-Capillary: 129 mg/dL — ABNORMAL HIGH (ref 70–99)
Glucose-Capillary: 101 mg/dL — ABNORMAL HIGH (ref 70–99)
Glucose-Capillary: 82 mg/dL (ref 70–99)

## 2021-01-18 LAB — IRON AND TIBC
Iron: 67 ug/dL (ref 28–170)
Saturation Ratios: 52 % — ABNORMAL HIGH (ref 10.4–31.8)
TIBC: 129 ug/dL — ABNORMAL LOW (ref 250–450)
UIBC: 62 ug/dL

## 2021-01-18 LAB — CBC
HCT: 27.9 % — ABNORMAL LOW (ref 36.0–46.0)
Hemoglobin: 9.2 g/dL — ABNORMAL LOW (ref 12.0–15.0)
MCH: 31.9 pg (ref 26.0–34.0)
MCHC: 33 g/dL (ref 30.0–36.0)
MCV: 96.9 fL (ref 80.0–100.0)
Platelets: 238 10*3/uL (ref 150–400)
RBC: 2.88 MIL/uL — ABNORMAL LOW (ref 3.87–5.11)
RDW: 18.6 % — ABNORMAL HIGH (ref 11.5–15.5)
WBC: 5.1 10*3/uL (ref 4.0–10.5)
nRBC: 0 % (ref 0.0–0.2)

## 2021-01-18 LAB — RETICULOCYTES
Immature Retic Fract: 10.9 % (ref 2.3–15.9)
RBC.: 2.93 MIL/uL — ABNORMAL LOW (ref 3.87–5.11)
Retic Count, Absolute: 12.6 10*3/uL — ABNORMAL LOW (ref 19.0–186.0)
Retic Ct Pct: 0.6 % (ref 0.4–3.1)

## 2021-01-18 LAB — URINE CULTURE

## 2021-01-18 LAB — MAGNESIUM: Magnesium: 1.7 mg/dL (ref 1.7–2.4)

## 2021-01-18 LAB — FOLATE: Folate: 10.2 ng/mL (ref >5.9)

## 2021-01-18 LAB — CORTISOL-AM, BLOOD: Cortisol - AM: 8.6 ug/dL (ref 6.7–22.6)

## 2021-01-18 LAB — FERRITIN: Ferritin: 1267 ng/mL — ABNORMAL HIGH (ref 11–307)

## 2021-01-18 LAB — VITAMIN B12: Vitamin B-12: 2339 pg/mL — ABNORMAL HIGH (ref 180–914)

## 2021-01-18 MED ORDER — POTASSIUM CHLORIDE CRYS ER 20 MEQ PO TBCR
40.0000 meq | EXTENDED_RELEASE_TABLET | Freq: Once | ORAL | Status: AC
  Administered 2021-01-18: 40 meq via ORAL
  Filled 2021-01-18: qty 2

## 2021-01-18 MED ORDER — MAGNESIUM SULFATE 2 GM/50ML IV SOLN
2.0000 g | Freq: Once | INTRAVENOUS | Status: AC
  Administered 2021-01-18: 2 g via INTRAVENOUS
  Filled 2021-01-18: qty 50

## 2021-01-18 MED ORDER — POTASSIUM PHOSPHATES 15 MMOLE/5ML IV SOLN
30.0000 mmol | Freq: Once | INTRAVENOUS | Status: AC
  Administered 2021-01-18: 30 mmol via INTRAVENOUS
  Filled 2021-01-18: qty 10

## 2021-01-18 MED ORDER — INSULIN ASPART 100 UNIT/ML ~~LOC~~ SOLN: 0.0000 [IU] | Freq: Three times a day (TID) | SUBCUTANEOUS | Status: DC

## 2021-01-18 NOTE — Progress Notes (Signed)
Daily Progress Note   Patient Name: Stefanie Phelps       Date: 01/18/2021 DOB: [birth year]  Age: 87 y.o. MRN#: [id number] Attending Physician: Mercy Riding, MD Primary Care Physician: Penelope Coop, FNP Admit Date: 01/16/2021  Reason for Follow-up: continued GOC discussion  Subjective: I spoke with grandson Louie Casa by phone. Daughter Caryl Asp and another daughter were present on speaker phone. Collectively, family is expressing significant concern over patient returning home. They report that her ability to care for herself has declined, and they are unable/unwilling to help provide care any longer. They share that another grandson (not Louie Casa) lives next door to patient as well. This grandson has been in prison multiple times and does illegal activities in his home (illicit drugs). Apparently the patient is very protective of this grandson and  the rest of the family now feels unsafe coming to her home. Family wants patient to go to a SNF.  I expressed concern to the family that patient was very adamant about not wanting to go to a SNF, and currently seemed to be able to make her own medical decisions. I provided reassurance that I would document our conversation and advised them to share these concerns with [facility] tomorrow.  Length of Stay: 2        Palliative Assessment/Data: PPS 50%       Palliative Care Assessment & Plan   HPI/Patient Profile: 87 y.o. female  with past medical history of myelodysplastic syndrome, anemia, COPD, DM, and hypertension. She presented to the emergency department on 01/16/2021 with weakness, decreased appetite, and multiple falls at home. She went to her PCP office 2/4, was found to be hypotensive, and sent to hospital by EMS.  ED Course: BP improved with IV fluid but  still orthostatic with SBP dropping from 130 to 70 with standing. Also found to have creatinine 1.69.  Admitted to [facility] for further management of orthostatic hypotension and AKI.   Assessment: - orthostatic hypotension likely in the setting of poor po intake and diarrhea - recent COVID-19 infection - sinus bradycardia  - dehydration - AKI on CKD - hypokalemia/hypophosphatemia/hypomagnesemia - history of MDS - debility - Severe malnutrition  Recommendations/Plan: DNR/DNI as previously documented Continue current medical care Patient's goal is to return home Family expressing safety concerns at home and wanting patient  to discharge to SNF Family advised to follow-up with [facility] tomorrow  Code Status: DNR/DNI  Prognosis: Unable to determine  Discharge Planning: To Be Determined    Thank you for allowing the Palliative Medicine Team to assist in the care of this patient.   Total Time 15 minutes Prolonged Time Billed  no       Greater than 50%  of this time was spent counseling and coordinating care related to the above assessment and plan.  Lavena Bullion, NP  Please contact Palliative Medicine Team phone at [phone] for questions and concerns.

## 2021-01-18 NOTE — Plan of Care (Signed)
Alert/oriented x4. Pt was already in her room before this shift began. Reports that she arrived to the unit about 6 pm. Patient at first refused to eat dinner, blood sugar low, asked the patient several times before she agreed to drink orange juice, ate little bit of crackers. Able to stand pivot to the bedside commode

## 2021-01-18 NOTE — Evaluation (Signed)
Occupational Therapy Evaluation Patient Details Name: Stefanie Phelps MRN: [id number] DOB: [birth year] Today's Date: 01/18/2021    History of Present Illness Patient is a 87 y/o female who presents from PCP due to hypotension, weakness, decreased appetite and recent falls. Admitted with orthostatic hypotension, AKI and hypokalemia. PMH includes HTN, DM, Myelodysplastic syndrome, COPD, psoriasis, aortic stenosis, recent COVID-19 infection.   Clinical Impression   Pt was assisted for IADL and intermittently using RW vs furniture walking in her home prior to admission. She presents with generalized weakness and poor standing balance requiring to stand, ambulate and perform ADL(sponge bathes). Pt is adamant she is going home upon discharge. Her grandson lives next door and checks on her throughout the day. Recommended pt keep her cordless phone with her at all times. Will follow acutely     Follow Up Recommendations  Home health OT    Equipment Recommendations  None recommended by OT    Recommendations for Other Services       Precautions / Restrictions Precautions Precautions: Fall Precaution Comments: recent falls Restrictions Weight Bearing Restrictions: No      Mobility Bed Mobility    General bed mobility comments: pt in chair    Transfers Overall transfer level: Needs assistance Equipment used: Rolling walker (2 wheeled) Transfers: Sit to/from Stand Sit to Stand: Min assist Stand pivot transfers: Min assist       General transfer comment: assist to rise and steady    Balance Overall balance assessment: History of Falls;Needs assistance Sitting-balance support: Feet supported;No upper extremity supported Sitting balance-Leahy Scale: Fair Sitting balance - Comments: supervision for safety   Standing balance support: During functional activity Standing balance-Leahy Scale: Poor Standing balance comment: leans on sink for balance                            ADL either performed or assessed with clinical judgement   ADL Overall ADL's : Needs assistance/impaired Eating/Feeding: Independent;Sitting   Grooming: Wash/dry hands;Standing;Min guard   Upper Body Bathing: Set up;Sitting   Lower Body Bathing: Minimal assistance;Sit to/from stand   Upper Body Dressing : Set up;Sitting   Lower Body Dressing: Minimal assistance;Sit to/from stand   Toilet Transfer: Minimal assistance;Ambulation;RW   Toileting- Clothing Manipulation and Hygiene: Minimal assistance;Sit to/from stand       Functional mobility during ADLs: Minimal assistance;Rolling walker       Vision Patient Visual Report: No change from baseline       Perception     Praxis      Pertinent Vitals/Pain Pain Assessment: No/denies pain     Hand Dominance Right   Extremity/Trunk Assessment Upper Extremity Assessment Upper Extremity Assessment: Overall WFL for tasks assessed   Lower Extremity Assessment Lower Extremity Assessment: Defer to PT evaluation RLE Deficits / Details: oozing cut on right knee from when she fell at home; itching a lot due to psoriasis.   Cervical / Trunk Assessment Cervical / Trunk Assessment: Kyphotic   Communication Communication Communication: HOH   Cognition Arousal/Alertness: Awake/alert Behavior During Therapy: WFL for tasks assessed/performed Overall Cognitive Status: No family/caregiver present to determine baseline cognitive functioning                                 General Comments: decreased awareness of safety and deficits   General Comments  Cuts/scrapes on LEs fromr ecent falls. Grandson lives next door and helps  a lot, sees her daily per report.    Exercises     Shoulder Instructions      Home Living Family/patient expects to be discharged to:: Private residence Living Arrangements: Alone Available Help at Discharge: Family;Available PRN/intermittently (grandson lives next door) Type of Home:  House Home Access: Level entry;Stairs to enter Entrance Stairs-Number of Steps: stairs in back   Home Layout: One level     Bathroom Shower/Tub: Teacher, early years/pre: Handicapped height     Home Equipment: Environmental consultant - 2 wheels;Cane - single point;Bedside commode;Tub bench;Wheelchair - manual;Hospital bed          Prior Functioning/Environment Level of Independence: Needs assistance  Gait / Transfers Assistance Needed: Minimal ambulator with RW vs furniture walker, household only. Hx of recent falls, ~2. ADL's / Homemaking Assistance Needed: sponge bathes, assist for IADL of grandson/family            OT Problem List: Decreased strength;Decreased activity tolerance;Impaired balance (sitting and/or standing);Decreased knowledge of use of DME or AE;Decreased safety awareness      OT Treatment/Interventions: Self-care/ADL training;DME and/or AE instruction;Patient/family education;Balance training;Cognitive remediation/compensation    OT Goals(Current goals can be found in the care plan section) Acute Rehab OT Goals Patient Stated Goal: adamant about going home OT Goal Formulation: With patient Time For Goal Achievement: 02/01/21 Potential to Achieve Goals: Good  OT Frequency: Min 2X/week   Barriers to D/C:            Co-evaluation              AM-PAC OT "6 Clicks" Daily Activity     Outcome Measure Help from another person eating meals?: None Help from another person taking care of personal grooming?: A Little Help from another person toileting, which includes using toliet, bedpan, or urinal?: A Little Help from another person bathing (including washing, rinsing, drying)?: A Little Help from another person to put on and taking off regular upper body clothing?: None Help from another person to put on and taking off regular lower body clothing?: A Little 6 Click Score: 20   End of Session Equipment Utilized During Treatment: Gait belt;Rolling  walker  Activity Tolerance: Patient tolerated treatment well Patient left: in chair;with call bell/phone within reach;with chair alarm set;with nursing/sitter in room  OT Visit Diagnosis: Unsteadiness on feet (R26.81);Other abnormalities of gait and mobility (R26.89);Pain;Muscle weakness (generalized) (M62.81);Other symptoms and signs involving cognitive function                Time: 5681-2751 OT Time Calculation (min): 26 min Charges:  OT General Charges $OT Visit: 1 Visit OT Evaluation $OT Eval Moderate Complexity: 1 Mod OT Treatments $Self Care/Home Management : 8-22 mins  Nestor Lewandowsky, OTR/L Acute Rehabilitation Services Pager: [phone] Office: [phone] Malka So 01/18/2021, 11:52 AM

## 2021-01-18 NOTE — Evaluation (Signed)
Physical Therapy Evaluation Patient Details Name: Stefanie Phelps MRN: [id number] DOB: [birth year] Today's Date: 01/18/2021   History of Present Illness  Patient is a 87 y/o female who presents from PCP due to hypotension, weakness, decreased appetite and recent falls. Admitted with orthostatic hypotension, AKI and hypokalemia. PMH includes HTN, DM, Myelodysplastic syndrome, COPD, psoriasis, aortic stenosis, recent COVID-19 infection.  Clinical Impression  Patient presents with generalized weakness, impaired balance, poor safety awareness and impaired mobility s/p above. Pt reports living alone and requiring some assist for ADLs PTA. Pt is a furniture walker vs RW at baseline and limited household ambulator per report. Reports 2 recent falls. Today, pt requires Min A for standing, transfers and gait training with use of RW (sort of) for support. No reports of dizziness during session. Pt backing up towards chair and LOB noted posteriorly requiring Min A to support. Pt adamant about returning home. Appears to have support from grandson who she reports lives next door. Would benefit from HHPT due to recent falls, weakness and balance impairments. If family can have more supervision initially at d/c, that would be the safest option. Will follow acutely to maximize independence and mobility prior to return home.    Follow Up Recommendations Home health PT;Supervision for mobility/OOB    Equipment Recommendations  None recommended by PT    Recommendations for Other Services       Precautions / Restrictions Precautions Precautions: Fall Precaution Comments: recent falls Restrictions Weight Bearing Restrictions: No      Mobility  Bed Mobility Overal bed mobility: Needs Assistance Bed Mobility: Supine to Sit;Sit to Supine     Supine to sit: Supervision;HOB elevated Sit to supine: Supervision;HOB elevated   General bed mobility comments: Performed x2 with use of rail, no assist.     Transfers Overall transfer level: Needs assistance Equipment used: Rolling walker (2 wheeled) Transfers: Sit to/from [employer];Stand Pivot Transfers Sit to Stand: Min assist Stand pivot transfers: Min assist       General transfer comment: Assist to power to standing with cues for hand placement/technique. Stood from [facility] x2, [facility]PT bed to[facility]from [facility] with Min A, transferred to chair post ambulation.  Ambulation/Gait Ambulation/Gait assistance: Min assist Gait Distance (Feet): 15 Feet Assistive device: Rolling walker (2 wheeled);None Gait Pattern/deviations: Step-through pattern;Decreased stride length;Trunk flexed Gait velocity: decreased   General Gait Details: Slow, guarded and unsteady gait  with 1 hand on RW and the other on the bed, pushing RW away and only using it sideways to have something to hold onto, backing up towards chair with LOB posteriorly, unsafe. Min A constantly for balance.  Stairs            Wheelchair Mobility    Modified Rankin (Stroke Patients Only)       Balance Overall balance assessment: History of Falls;Needs assistance Sitting-balance support: Feet supported;No upper extremity supported Sitting balance-Leahy Scale: Fair Sitting balance - Comments: supervision for safety   Standing balance support: During functional activity Standing balance-Leahy Scale: Poor Standing balance comment: Requires at least 1 UE support, or external support, poor balance walking backwards                             Pertinent Vitals/Pain Pain Assessment: No/denies pain    Home Living Family/patient expects to be discharged to:: Private residence Living Arrangements: Alone Available Help at Discharge: Family;Available PRN/intermittently Type of Home: House Home Access: Level entry;Stairs to enter   Entrance Stairs-Number  of Steps: stairs in back Home Layout: One level Home Equipment: [facility] - 2 wheels;Cane - single point;Bedside commode;Shower  seat      Prior Function Level of Independence: Needs assistance   Gait / Transfers Assistance Needed: Minimal ambulator with RW vs furniture walker, household only. Hx of recent falls, ~2.  ADL's / Homemaking Assistance Needed: Needs assist with ADLs from grandson, Grandson/family does IADLs and drives.  Comments: USes RW for ambulation as neded.     Hand Dominance        Extremity/Trunk Assessment   Upper Extremity Assessment Upper Extremity Assessment: Defer to OT evaluation    Lower Extremity Assessment Lower Extremity Assessment: Generalized weakness;RLE deficits/detail (but functional) RLE Deficits / Details: oozing cut on right knee from when she fell at home; itching a lot due to psoriasis.    Cervical / Trunk Assessment Cervical / Trunk Assessment: Kyphotic  Communication   Communication: HOH  Cognition Arousal/Alertness: Awake/alert Behavior During Therapy: WFL for tasks assessed/performed Overall Cognitive Status: No family/caregiver present to determine baseline cognitive functioning                                 General Comments: Mumbled, low speech at times different to understand. Follows commands with repetition and increased time. HOH. poor awareness of safety/deficits, adamant about going home.      General Comments General comments (skin integrity, edema, etc.): Cuts/scrapes on LEs fromr ecent falls. Grandson lives next door and helps a lot, sees her daily per report.    Exercises     Assessment/Plan    PT Assessment Patient needs continued PT services  PT Problem List Decreased strength;Decreased mobility;Decreased safety awareness;Decreased cognition;Decreased balance;Decreased knowledge of use of DME;Decreased skin integrity       PT Treatment Interventions DME instruction;Therapeutic exercise;Gait training;Balance training;Functional mobility training;Therapeutic activities;Patient/family education;Stair training    PT Goals  (Current goals can be found in the Care Plan section)  Acute Rehab PT Goals Patient Stated Goal: adamant about going home PT Goal Formulation: With patient Time For Goal Achievement: 02/01/21 Potential to Achieve Goals: Good    Frequency Min 3X/week   Barriers to discharge Decreased caregiver support lives alone    Co-evaluation               AM-PAC PT "6 Clicks" Mobility  Outcome Measure Help needed turning from your back to your side while in a flat bed without using bedrails?: A Little Help needed moving from lying on your back to sitting on the side of a flat bed without using bedrails?: A Little Help needed moving to and from a bed to a chair (including a wheelchair)?: A Little Help needed standing up from a chair using your arms (e.g., wheelchair or bedside chair)?: A Little Help needed to walk in hospital room?: A Little Help needed climbing 3-5 steps with a railing? : A Little 6 Click Score: 18    End of Session Equipment Utilized During Treatment: Gait belt Activity Tolerance: Patient tolerated treatment well Patient left: in chair;with call bell/phone within reach;with chair alarm set;with nursing/sitter in room Nurse Communication: Mobility status PT Visit Diagnosis: Unsteadiness on feet (R26.81);Difficulty in walking, not elsewhere classified (R26.2)    Time: 1191-4782 PT Time Calculation (min) (ACUTE ONLY): 23 min   Charges:   PT Evaluation $PT Eval Moderate Complexity: 1 Mod PT Treatments $Therapeutic Activity: 8-22 mins        Aleiah Mohammed H,  PT, DPT Acute Rehabilitation Services Pager [phone] Office [phone]      Lacie Draft 01/18/2021, 8:37 AM

## 2021-01-18 NOTE — Progress Notes (Signed)
PROGRESS NOTE  Stefanie Phelps RCV:[id number] DOB: 1930/05/04   PCP: Penelope Coop, FNP  Patient is from: Home.  Lives alone.  Uses walker at baseline.  DOA: 01/16/2021 LOS: 2  Chief complaints: Weakness, fall and diarrhea  Brief Narrative / Interim history: 87 year old female with PMH of MDS, COPD, DM-2, HTN, anemia, debility (walker dependent) and recent COVID-19 infection sent to ED from PCP office due to hypotension. Recently treated for UTI by PCP. She had weakness, poor p.o. intake, multiple falls and diarrhea for about 1 to 2 weeks.  She presented to PCP for evaluation and found to be hypotensive to 64/42, and sent to ED by EMS.   Admitted for orthostatic hypotension, AKI,  Hypokalemia and diarrhea.  Orthostatic vitals positive for admission.  C. difficile negative.  COVID-19 PCR negative.  UA with large LE and many bacteria but no UTI symptoms.  Started on IV fluid with improvement in her symptoms.  Subjective: Seen and examined earlier this morning.  No major events overnight or this morning.  Feels much better.  No complaints.  Asking if she could be released today.  Denies chest pain, dyspnea, nausea, vomiting or UTI symptoms.  She is not sure about diarrhea.  Objective: Vitals:   01/18/21 0514 01/18/21 0517 01/18/21 0734 01/18/21 1105  BP:  (!) 106/59 (!) 118/59 (!) 100/50  Pulse:  65 66 72  Resp:  16 20 18   Temp:  (!) 97.5 F (36.4 C) 97.8 F (36.6 C) 97.8 F (36.6 C)  TempSrc:  Oral Oral Oral  SpO2:  92% 96% 100%  Weight: 51.2 kg     Height: 5\' 4"  (1.626 m)       Intake/Output Summary (Last 24 hours) at 01/18/2021 1145 Last data filed at 01/18/2021 0529 Gross per 24 hour  Intake 1177.46 ml  Output -  Net 1177.46 ml   Filed Weights   01/17/21 1830 01/18/21 0514  Weight: 51.3 kg 51.2 kg    Examination:  GENERAL: No apparent distress.  Sitting on bedside chair. HEENT: MMM.  Vision grossly intact.  Diminished hearing. NECK: Supple.  No apparent JVD.  RESP:  No  IWOB.  Fair aeration bilaterally. CVS:  RRR. Heart sounds normal.  ABD/GI/GU: BS+. Abd soft, NTND.  MSK/EXT:  Moves extremities. No apparent deformity. No edema.  SKIN: Bruising over left face and right knee improving. NEURO: Awake, alert and oriented appropriately.  No apparent focal neuro deficit. PSYCH: Calm. Normal affect.   Procedures:  None  Microbiology summarized: COVID-19 PCR negative. Blood cultures NGTD. C. difficile negative. Urine culture pending.  Assessment & Plan: Orthostatic hypotension: Likely in the setting of poor p.o. intake, diarrhea, recent COVID-19 infection and concurrent use of diuretics and antihypertensive meds.  She was also bradycardic.  TTE unchanged from prior.  Cortisol on lower side of normal.  Bradycardia resolved after holding home metoprolol. -Discontinue IV fluid -Continue holding home antihypertensive meds in the setting of soft blood pressures. -Orthostatic vitals  Sinus bradycardia/abnormal EKG/elevated troponin-EKG as above.  Mild troponin elevation likely demand ischemia and delayed clearance.  She has no chest pain.  Bradycardia likely due to metoprolol, and resolved. TTE unchanged from prior. -Continue holding home metoprolol due to soft blood pressure  Paroxysmal A. fib: Rate controlled.  On metoprolol at home.  Not on anticoagulation. -Holding home metoprolol in the setting of soft blood pressures.  Moderate aortic stenosis: Stable on TTE.  Chronic diastolic CHF: TTE with LVEF of 55 to 60%, G1 DD and  mod AS. On Lasix at home.  Appears euvolemic. -Discontinue IV fluid -Continue holding home Lasix and metoprolol -Monitor fluid status and renal function  Dehydration/diarrhea: Abdominal exam benign.  C. difficile negative.  Seems to have resolved. -Continue as needed Imodium  AKI on CKD-3A-likely prerenal.  AKI resolved Recent Labs    10/08/20 0000 10/30/20 0000 11/20/20 0000 01/16/21 1746 01/17/21 0435 01/18/21 0354  BUN 20  15 20  42* 38* 25*  CREATININE 1.3* 1.2* 1.3* 1.69* 1.46* 1.13*  -Continue holding diuretics -Recheck in the morning  Non-anion gap metabolic acidosis: Likely due to renal failure.  Improved. -Continue monitoring  Hypokalemia/hypophosphatemia/hypomagnesemia: K3.4.  P 1.3.  Mg 1.7.  -Replenish and recheck in the morning  Chronic COPD: Stable. -Continue home breathing treatments  Controlled NIDDM-2 with CKD-3A: On Januvia at home. -Continue home Januvia -CBG monitoring and SSI  History of MDS-followed by Dr. Bobby Rumpf at [facility] -Outpatient follow-up  Recent COVID-19 infection-no respiratory symptoms. -No airborne isolation indicated.  Asymptomatic bacteriuria-recently treated for UTI.  No UTI symptoms. Received a dose of CTX. -No indication for antibiotics  Debility/generalized weakness/recurrent fall: Cranial, cervical and maxillofacial CT without significant finding. -PT/OT  Severe malnutrition Body mass index is 19.38 kg/m.  -Consult dietitian.       DVT prophylaxis:  heparin injection 5,000 Units Start: 01/17/21 0600  Code Status: DNR/DNI Family Communication: Updated patient's daughter over the phone Level of care: Telemetry Medical Status is: Inpatient  Remains inpatient appropriate because:Hemodynamically unstable, Persistent severe electrolyte disturbances, IV treatments appropriate due to intensity of illness or inability to take PO and Inpatient level of care appropriate due to severity of illness   Dispo: The patient is from: Home              Anticipated d/c is to: Home with [facility]              Anticipated d/c date is: 1 day              Patient currently is not medically stable to d/c.   Difficult to place patient No       Consultants:  Palliative medicine   Sch Meds:  Scheduled Meds: . allopurinol  100 mg Oral Daily  . fluticasone furoate-vilanterol  1 puff Inhalation Daily  . heparin injection (subcutaneous)  5,000 Units Subcutaneous Q8H  .  insulin aspart  0-5 Units Subcutaneous QHS  . insulin aspart  0-9 Units Subcutaneous TID WC  . linagliptin  5 mg Oral Daily   Continuous Infusions: . potassium PHOSPHATE IVPB (in mmol) 30 mmol (01/18/21 1125)   PRN Meds:.acetaminophen **OR** acetaminophen, HYDROcodone-acetaminophen, hydrOXYzine, loperamide  Antimicrobials: Anti-infectives (From admission, onward)   Start     Dose/Rate Route Frequency Ordered Stop   01/16/21 2330  cefTRIAXone (ROCEPHIN) 1 g in sodium chloride 0.9 % 100 mL IVPB        1 g 200 mL/hr over 30 Minutes Intravenous  Once 01/16/21 2317 01/17/21 0059       I have personally reviewed the following labs and images: CBC: Recent Labs  Lab 01/16/21 1746 01/17/21 0435 01/18/21 0354  WBC 5.3 5.4 5.1  NEUTROABS 2.7  --   --   HGB 10.0* 9.5* 9.2*  HCT 32.2* 30.9* 27.9*  MCV 101.6* 102.3* 96.9  PLT 288 246 238   BMP &GFR Recent Labs  Lab 01/16/21 1746 01/16/21 2036 01/17/21 0435 01/18/21 0354  NA 139  --  142 142  K 2.9*  --  3.8 3.4*  CL  109  --  113* 114*  CO2 19*  --  16* 20*  GLUCOSE 102*  --  80 89  BUN 42*  --  38* 25*  CREATININE 1.69*  --  1.46* 1.13*  CALCIUM 8.7*  --  8.7* 8.7*  MG  --  2.1  --  1.7  PHOS  --   --   --  1.3*   Estimated Creatinine Clearance: 27.8 mL/min (A) (by C-G formula based on SCr of 1.13 mg/dL (H)). Liver & Pancreas: Recent Labs  Lab 01/16/21 1746 01/18/21 0354  AST 24  --   ALT 13  --   ALKPHOS 44  --   BILITOT 1.3*  --   PROT 6.5  --   ALBUMIN 2.7* 2.2*   No results for input(s): LIPASE, AMYLASE in the last 168 hours. No results for input(s): AMMONIA in the last 168 hours. Diabetic: Recent Labs    01/17/21 0435  HGBA1C 5.8*   Recent Labs  Lab 01/17/21 2247 01/18/21 0656  GLUCAP 77 84   Cardiac Enzymes: No results for input(s): CKTOTAL, CKMB, CKMBINDEX, TROPONINI in the last 168 hours. No results for input(s): PROBNP in the last 8760 hours. Coagulation Profile: No results for input(s):  INR, PROTIME in the last 168 hours. Thyroid Function Tests: Recent Labs    01/17/21 0800  TSH 0.358   Lipid Profile: No results for input(s): CHOL, HDL, LDLCALC, TRIG, CHOLHDL, LDLDIRECT in the last 72 hours. Anemia Panel: Recent Labs    01/18/21 0354  VITAMINB12 2,339*  FOLATE 10.2  FERRITIN 1,267*  TIBC 129*  IRON 67  RETICCTPCT 0.6   Urine analysis:    Component Value Date/Time   COLORURINE YELLOW 01/16/2021 2254   APPEARANCEUR HAZY (A) 01/16/2021 2254   LABSPEC 1.017 01/16/2021 2254   PHURINE 5.0 01/16/2021 2254   GLUCOSEU NEGATIVE 01/16/2021 2254   HGBUR MODERATE (A) 01/16/2021 2254   BILIRUBINUR NEGATIVE 01/16/2021 2254   KETONESUR 5 (A) 01/16/2021 2254   PROTEINUR 30 (A) 01/16/2021 2254   NITRITE NEGATIVE 01/16/2021 2254   LEUKOCYTESUR LARGE (A) 01/16/2021 2254   Sepsis Labs: Invalid input(s): PROCALCITONIN, [place]  Microbiology: Recent Results (from the past 240 hour(s))  Culture, blood (routine x 2)     Status: None (Preliminary result)   Collection Time: 01/16/21  8:08 PM   Specimen: BLOOD RIGHT ARM  Result Value Ref Range Status   Specimen Description BLOOD RIGHT ARM  Final   Special Requests   Final    BOTTLES DRAWN AEROBIC AND ANAEROBIC Blood Culture adequate volume   Culture   Final    NO GROWTH < 24 HOURS Performed at [facility], [address] [place], [place] [address]    Report Status PENDING  Incomplete  Urine culture     Status: Abnormal   Collection Time: 01/16/21 10:55 PM   Specimen: Urine, Random  Result Value Ref Range Status   Specimen Description URINE, RANDOM  Final   Special Requests   Final    NONE Performed at [facility], [place] [address] [place],  [address]    Culture MULTIPLE SPECIES PRESENT, SUGGEST RECOLLECTION (A)  Final   Report Status 01/18/2021 FINAL  Final  SARS CORONAVIRUS 2 (TAT 6-24 HRS) Nasopharyngeal Nasopharyngeal Swab     Status: None   Collection Time: 01/17/21 12:23 AM    Specimen: Nasopharyngeal Swab  Result Value Ref Range Status   SARS Coronavirus 2 NEGATIVE NEGATIVE Final    Comment: (  NOTE) SARS-CoV-2 target nucleic acids are NOT DETECTED.  The SARS-CoV-2 RNA is generally detectable in upper and lower respiratory specimens during the acute phase of infection. Negative results do not preclude SARS-CoV-2 infection, do not rule out co-infections with other pathogens, and should not be used as the sole basis for treatment or other patient management decisions. Negative results must be combined with clinical observations, patient history, and epidemiological information. The expected result is Negative.  Fact Sheet for Patients: SugarRoll.be  Fact Sheet for Healthcare Providers: https://www.woods-mathews.com/  This test is not yet approved or cleared by the [place] FDA and  has been authorized for detection and/or diagnosis of SARS-CoV-2 by FDA under an Emergency Use Authorization (EUA). This EUA will remain  in effect (meaning this test can be used) for the duration of the COVID-19 declaration under Se ction 564(b)(1) of the Act, 21 U.S.C. section 360bbb-3(b)(1), unless the authorization is terminated or revoked sooner.  Performed at [facility], [place] [address] [place], [place] [address]   C Difficile Quick Screen w PCR reflex     Status: None   Collection Time: 01/17/21  8:04 AM   Specimen: STOOL  Result Value Ref Range Status   C Diff antigen NEGATIVE NEGATIVE Final   C Diff toxin NEGATIVE NEGATIVE Final   C Diff interpretation No C. difficile detected.  Final    Comment: Performed at [facility], [place] [address], [place], [place] [address]    Radiology Studies: ECHOCARDIOGRAM COMPLETE  Result Date: 01/17/2021    ECHOCARDIOGRAM REPORT   Patient Name:   AYVREE RICKER Date of Exam: 01/17/2021 Medical Rec #:  [id number]     Height:       64.0 in Accession #:    [id number]    Weight:        120.7 lb Date of Birth:  09/06/1930     BSA:          1.579 m Patient Age:    3 years      BP:           147/65 mmHg Patient Gender: F             HR:           62 bpm. Exam Location:  Inpatient Procedure: 2D Echo, Cardiac Doppler and Color Doppler Indications:    I35.0 Nonrheumatic aortic (valve) stenosis  History:        Patient has prior history of Echocardiogram examinations, most                 recent 03/18/2019. COPD; Risk Factors:Hypertension and Diabetes.  Sonographer:    Tiffany Dance Referring Phys: [id number] Charlesetta Ivory GONFA IMPRESSIONS  1. Left ventricular ejection fraction, by estimation, is 55 to 60%. The left ventricle has normal function. The left ventricle has no regional wall motion abnormalities. There is mild concentric left ventricular hypertrophy. Left ventricular diastolic parameters are consistent with Grade I diastolic dysfunction (impaired relaxation). Elevated left atrial pressure.  2. Right ventricular systolic function is normal. The right ventricular size is normal. There is normal pulmonary artery systolic pressure.  3. The mitral valve is normal in structure. No evidence of mitral valve regurgitation. No evidence of mitral stenosis.  4. The aortic valve is normal in structure. There is severe calcifcation of the aortic valve. There is severe thickening of the aortic valve. Aortic valve regurgitation is mild to moderate. Moderate aortic valve stenosis. Aortic valve mean gradient measures  16.0 mmHg.  5. The inferior vena cava is normal in size with greater than 50% respiratory variability, suggesting right atrial pressure of 3 mmHg. Comparison(s): No significant change from prior study. FINDINGS  Left Ventricle: Left ventricular ejection fraction, by estimation, is 55 to 60%. The left ventricle has normal function. The left ventricle has no regional wall motion abnormalities. The left ventricular internal cavity size was normal in size. There is  mild concentric left ventricular  hypertrophy. Left ventricular diastolic parameters are consistent with Grade I diastolic dysfunction (impaired relaxation). Elevated left atrial pressure. Right Ventricle: The right ventricular size is normal. No increase in right ventricular wall thickness. Right ventricular systolic function is normal. There is normal pulmonary artery systolic pressure. The tricuspid regurgitant velocity is 2.52 m/s, and  with an assumed right atrial pressure of 3 mmHg, the estimated right ventricular systolic pressure is [id number] mmHg. Left Atrium: Left atrial size was normal in size. Right Atrium: Right atrial size was normal in size. Pericardium: There is no evidence of pericardial effusion. Mitral Valve: The mitral valve is normal in structure. There is moderate thickening of the mitral valve leaflet(s). There is moderate calcification of the mitral valve leaflet(s). Mild mitral annular calcification. No evidence of mitral valve regurgitation. No evidence of mitral valve stenosis. Tricuspid Valve: The tricuspid valve is normal in structure. Tricuspid valve regurgitation is mild . No evidence of tricuspid stenosis. Aortic Valve: The aortic valve is normal in structure. There is severe calcifcation of the aortic valve. There is severe thickening of the aortic valve. Aortic valve regurgitation is mild to moderate. Aortic regurgitation PHT measures 685 msec. Moderate aortic stenosis is present. Aortic valve mean gradient measures 16.0 mmHg. Aortic valve peak gradient measures 29.9 mmHg. Aortic valve area, by VTI measures 0.67 cm. Pulmonic Valve: The pulmonic valve was normal in structure. Pulmonic valve regurgitation is not visualized. No evidence of pulmonic stenosis. Aorta: The aortic root is normal in size and structure. Venous: The inferior vena cava is normal in size with greater than 50% respiratory variability, suggesting right atrial pressure of 3 mmHg. IAS/Shunts: No atrial level shunt detected by color flow Doppler.  LEFT  VENTRICLE PLAX 2D LVIDd:         4.30 cm  Diastology LVIDs:         2.90 cm  LV e' medial:    5.04 cm/s LV PW:         1.00 cm  LV E/e' medial:  14.7 LV IVS:        0.90 cm  LV e' lateral:   6.15 cm/s LVOT diam:     1.70 cm  LV E/e' lateral: 12.0 LV SV:         43 LV SV Index:   27 LVOT Area:     2.27 cm  RIGHT VENTRICLE             IVC RV Basal diam:  2.60 cm     IVC diam: 1.60 cm RV S prime:     17.70 cm/s TAPSE (M-mode): 1.7 cm LEFT ATRIUM             Index       RIGHT ATRIUM           Index LA diam:        3.60 cm 2.28 cm/m  RA Area:     11.10 cm LA Vol (A2C):   60.3 ml 38.19 ml/m RA Volume:   26.80 ml  16.98 ml/m LA  Vol (A4C):   57.5 ml 36.42 ml/m LA Biplane Vol: 60.5 ml 38.32 ml/m  AORTIC VALVE AV Area (Vmax):    0.72 cm AV Area (Vmean):   0.64 cm AV Area (VTI):     0.67 cm AV Vmax:           273.50 cm/s AV Vmean:          189.500 cm/s AV VTI:            0.641 m AV Peak Grad:      29.9 mmHg AV Mean Grad:      16.0 mmHg LVOT Vmax:         86.40 cm/s LVOT Vmean:        53.700 cm/s LVOT VTI:          0.189 m LVOT/AV VTI ratio: 0.29 AI PHT:            685 msec  AORTA Ao Root diam: 3.60 cm MITRAL VALVE                TRICUSPID VALVE MV Area (PHT): 2.09 cm     TR Peak grad:   25.4 mmHg MV Decel Time: 363 msec     TR Vmax:        252.00 cm/s MV E velocity: 74.10 cm/s MV A velocity: 111.00 cm/s  SHUNTS MV E/A ratio:  0.67         Systemic VTI:  0.19 m                             Systemic Diam: 1.70 cm Ena Dawley MD Electronically signed by Ena Dawley MD Signature Date/Time: 01/17/2021/2:00:51 PM    Final       Taye T. Prince  If 7PM-7AM, please contact night-coverage [email or web address] 01/18/2021, 11:45 AM

## 2021-01-19 ENCOUNTER — Inpatient Hospital Stay: Payer: Medicare PPO

## 2021-01-19 ENCOUNTER — Telehealth: Payer: Self-pay | Admitting: Oncology

## 2021-01-19 ENCOUNTER — Inpatient Hospital Stay: Payer: Medicare PPO | Admitting: Oncology

## 2021-01-19 ENCOUNTER — Other Ambulatory Visit: Payer: Self-pay | Admitting: Oncology

## 2021-01-19 ENCOUNTER — Encounter (HOSPITAL_COMMUNITY): Payer: Self-pay | Admitting: Family Medicine

## 2021-01-19 DIAGNOSIS — I10 Essential (primary) hypertension: Secondary | ICD-10-CM

## 2021-01-19 DIAGNOSIS — R5381 Other malaise: Secondary | ICD-10-CM

## 2021-01-19 DIAGNOSIS — E440 Moderate protein-calorie malnutrition: Secondary | ICD-10-CM

## 2021-01-19 DIAGNOSIS — D461 Refractory anemia with ring sideroblasts: Secondary | ICD-10-CM

## 2021-01-19 DIAGNOSIS — R296 Repeated falls: Secondary | ICD-10-CM

## 2021-01-19 LAB — RENAL FUNCTION PANEL
Albumin: 2.2 g/dL — ABNORMAL LOW (ref 3.5–5.0)
Anion gap: 7 (ref 5–15)
BUN: 19 mg/dL (ref 8–23)
CO2: 21 mmol/L — ABNORMAL LOW (ref 22–32)
Calcium: 8.4 mg/dL — ABNORMAL LOW (ref 8.9–10.3)
Chloride: 112 mmol/L — ABNORMAL HIGH (ref 98–111)
Creatinine, Ser: 1.1 mg/dL — ABNORMAL HIGH (ref 0.44–1.00)
GFR, Estimated: 48 mL/min — ABNORMAL LOW (ref >60)
Glucose, Bld: 89 mg/dL (ref 70–99)
Phosphorus: 2.7 mg/dL (ref 2.5–4.6)
Potassium: 4.5 mmol/L (ref 3.5–5.1)
Sodium: 140 mmol/L (ref 135–145)

## 2021-01-19 LAB — GLUCOSE, CAPILLARY
Glucose-Capillary: 61 mg/dL — ABNORMAL LOW (ref 70–99)
Glucose-Capillary: 71 mg/dL (ref 70–99)
Glucose-Capillary: 109 mg/dL — ABNORMAL HIGH (ref 70–99)
Glucose-Capillary: 92 mg/dL (ref 70–99)
Glucose-Capillary: 79 mg/dL (ref 70–99)
Glucose-Capillary: 77 mg/dL (ref 70–99)

## 2021-01-19 LAB — MAGNESIUM: Magnesium: 1.8 mg/dL (ref 1.7–2.4)

## 2021-01-19 MED ORDER — ENALAPRIL MALEATE 20 MG PO TABS: 20.0000 mg | ORAL_TABLET | Freq: Every day | ORAL | Status: AC

## 2021-01-19 MED ORDER — FUROSEMIDE 20 MG PO TABS: 20.0000 mg | ORAL_TABLET | Freq: Every day | ORAL | Status: AC

## 2021-01-19 MED ORDER — LOPERAMIDE HCL 2 MG PO CAPS: 2.0000 mg | ORAL_CAPSULE | ORAL | 0 refills | Status: DC | PRN

## 2021-01-19 MED ORDER — METOPROLOL SUCCINATE ER 25 MG PO TB24: 25.0000 mg | ORAL_TABLET | Freq: Every day | ORAL | 0 refills | Status: AC

## 2021-01-19 NOTE — TOC Transition Note (Addendum)
Transition of Care [facility]) - CM/SW Discharge Note   Patient Details  Name: Stefanie Phelps MRN: [id number] Date of Birth: 1930-10-05  Transition of Care [facility]) CM/SW Contact:  Zenon Mayo, RN Phone Number: 01/19/2021, 9:49 AM   Clinical Narrative:    NCM spoke with patient, she gave this NCM permission to speak with her grandson , Louie Casa.  NCM offered choice to Louie Casa, he put his wife on the phone, she states they do not have a preference of the agency.  NCM made referral to [facility] with [facility] for [place], [place] and SW.  Soc will begin 24 to 48 hrs post dc.  She has rolling walker, w/chair, 3 n 1 and shower chair at home.  Louie Casa will transport her home today after his MD appts today around 3 or 4 pm.  NCM asked patient if she falls at home, she states yes she has , NCM asked if she has thought about going to a SNF, she states she is not going to a SNF  She would prefer home with [facility] services. She is alert and oriented x 4.    Final next level of care: [place] Barriers to Discharge: No Barriers Identified   Patient Goals and CMS Choice Patient states their goals for this hospitalization and ongoing recovery are:: get better CMS Medicare.gov Compare Post Acute Care list provided to:: Patient Represenative (must comment) Choice offered to / list presented to : Adult Children  Discharge Placement                       Discharge Plan and Services                  DME Agency: NA       HH Arranged: PT,OT,Social Work [place]: [place] Date [place]: 01/19/21 Time [place]: [phone] Representative spoke with at [place]: [place]) Interventions     Readmission Risk Interventions Readmission Risk Prevention Plan 01/19/2021  Post Dischage Appt Complete  Medication Screening Complete  Transportation Screening Complete  Some recent data might be hidden

## 2021-01-19 NOTE — Discharge Summary (Signed)
Physician Discharge Summary  Stefanie Phelps ZJI:[id number] DOB: [birth year] DOA: 01/16/2021  PCP: Penelope Coop, FNP  Admit date: 01/16/2021 Discharge date: 01/19/2021  Admitted From: Home Disposition: Home  Recommendations for Outpatient Follow-up:  1. Follow ups as below. 2. Please obtain CBC/BMP/Mag at follow up 3. Please follow up on the following pending results: None  Home Health: PT/OT/CSW Equipment/Devices: None  Discharge Condition: Stable CODE STATUS: DNR/DNI   Follow-up Information    Penelope Coop, FNP. Go on 01/29/2021.   Specialty: Family Medicine Why: @10 :15am Contact information: [address] [place] [address] [phone]        Care, [facility] Follow up.   Specialty: Home Health Services Why: HHPT,HHOT, SW Contact information: [place] [address] [phone]               Hospital Course: 87 year old female with PMH of MDS, COPD, DM-2, HTN, anemia, debility (walker dependent) and recent COVID-19 infection sent to ED from PCP office due to hypotension. Recently treated for UTI by PCP. She had weakness, poor p.o. intake, multiple falls and diarrhea for about 1 to 2 weeks.  She presented to PCP for evaluation and found to be hypotensive to 64/42, and sent to ED by EMS.   Admitted for orthostatic hypotension, AKI,  Hypokalemia and diarrhea.  Orthostatic vitals positive for admission.  C. difficile negative.  COVID-19 PCR negative.  UA with large LE and many bacteria but no UTI symptoms.    Patient was started on IV fluid and home antihypertensive meds held on admission. Symptoms improved.  Bradycardia, hypotension, orthostasis, AKI and electrolyte derangements resolved.  Evaluated by therapy who recommended home health PT/OT.   On the day of discharge, felt well and ready to go home.  Home hydralazine discontinued.  Advised to hold home enalapril and Lasix for a week.  Decreased  home metoprolol XL to 25 mg daily.  See individual problem list below for more on hospital course.  Discharge Diagnoses:  Orthostatic hypotension: Likely in the setting of poor p.o. intake, diarrhea, recent COVID-19 infection and concurrent use of diuretics and antihypertensive meds.  She was also bradycardic.  TTE unchanged from prior.  Cortisol on lower side of normal.  Orthostatic hypotension and bradycardia resolved.  Remained stable off IV fluid. Home hydralazine discontinued.  Advised to hold home enalapril and Lasix for a week.  Decreased home metoprolol XL to 25 mg daily.  Sinus bradycardia/abnormal EKG/elevated troponin-EKG as above.  Mild troponin elevation likely demand ischemia and delayed clearance.  She has no chest pain.  Bradycardia likely due to metoprolol, and resolved. TTE unchanged from prior. -Resume home metoprolol XL at 25 mg daily.  Was on 50 mg daily.  Paroxysmal A. fib: Rate controlled.  On metoprolol at home.  Not on anticoagulation. -Home metoprolol as above  Moderate aortic stenosis: Stable on TTE. -Hold diuretics for a week  Chronic diastolic CHF: TTE with LVEF of 55 to 60%, G1 DD and mod AS. On Lasix at home.  Appears euvolemic. -Cardiac meds as above  Dehydration/diarrhea: Abdominal exam benign.  C. difficile negative.  Seems to have resolved. -Continue Imodium as needed  AKI on CKD-3A-likely prerenal.  AKI resolved Recent Labs    10/08/20 0000 10/30/20 0000 11/20/20 0000 01/16/21 1746 01/17/21 0435 01/18/21 0354 01/19/21 0206  BUN 20 15 20  42* 38* 25* 19  CREATININE 1.3* 1.2* 1.3* 1.69* 1.46* 1.13* 1.10*  -Recommend holding home  enalapril and Lasix for a week -Recheck BMP at follow-up  Non-anion gap metabolic acidosis: Likely due to renal failure.   Resolved.  Hypokalemia/hypophosphatemia/hypomagnesemia:  Resolved. -Recheck at follow-up  Chronic COPD: Stable. -Continue home breathing treatments  Controlled NIDDM-2 with CKD-3A: On  Januvia at home. -Continue home Januvia  History of MDS-followed by Dr. Bobby Rumpf at [place] -Outpatient follow-up  Recent COVID-19 infection-no respiratory symptoms. -No airborne isolation indicated.  Asymptomatic bacteriuria-recently treated for UTI.  No UTI symptoms. Received a dose of CTX. -No indication for antibiotics  Debility/generalized weakness/recurrent fall: Cranial, cervical and maxillofacial CT without significant finding. -Continue PT/OT at home  Moderate malnutrition Body mass index is 19.24 kg/m.  -May consider liberating diet          Discharge Exam: Vitals:   01/19/21 0420 01/19/21 1157  BP: 116/70 135/69  Pulse: (!) 59 64  Resp: 18 16  Temp: 97.7 F (36.5 C) 98.2 F (36.8 C)  SpO2: 94% 95%    GENERAL: No apparent distress.  Nontoxic. HEENT: MMM.  Vision grossly intact.  Diminished hearing. NECK: Supple.  No apparent JVD.  RESP: On RA.  No IWOB.  Fair aeration bilaterally. CVS:  RRR. Heart sounds normal.  ABD/GI/GU: Bowel sounds present. Soft. Non tender.  MSK/EXT:  Moves extremities. No apparent deformity. No edema.  SKIN: no apparent skin lesion or wound NEURO: Awake, alert and oriented appropriately.  No apparent focal neuro deficit. PSYCH: Calm. Normal affect.  Discharge Instructions  Discharge Instructions    Call MD for:  difficulty breathing, headache or visual disturbances   Complete by: As directed    Call MD for:  extreme fatigue   Complete by: As directed    Call MD for:  persistant dizziness or light-headedness   Complete by: As directed    Call MD for:  persistant nausea and vomiting   Complete by: As directed    Diet - low sodium heart healthy   Complete by: As directed    Discharge instructions   Complete by: As directed    It has been a pleasure taking care of you!  You were hospitalized due to low blood pressure and acute kidney injury likely from not drinking enough and taking your blood pressure medication at the  same time.  Your blood pressure and kidney function improved after stopping blood pressure medications.  We have stopped some of your blood pressure medications until you follow-up with your primary care doctor in 1 to 2 weeks.  Recommend making an effort to keep yourself hydrated.  Please review your new medication list and the directions on your medications before you take them.   Take care,   Increase activity slowly   Complete by: As directed      Allergies as of 01/19/2021   No Known Allergies     Medication List    STOP taking these medications   hydrALAZINE 25 MG tablet Commonly known as: APRESOLINE     TAKE these medications   Adalimumab 40 MG/0.4ML Pnkt Inject 40 mg into the skin every Sunday.   allopurinol 300 MG tablet Commonly known as: ZYLOPRIM Take 300 mg by mouth daily.   CLOBETASOL PROPIONATE EMULSION EX Apply 1 application topically 2 (two) times daily as needed (legs).   Cyanocobalamin 1000 MCG/ML Kit Inject 1,000 mcg as directed every 30 (thirty) days.   enalapril 20 MG tablet Commonly known as: VASOTEC Take 1 tablet (20 mg total) by mouth daily. Start taking on: January 26, 2021 What changed:  These instructions start on January 26, 2021. If you are unsure what to do until then, ask your doctor or other care provider.   Fluticasone-Salmeterol 250-50 MCG/DOSE Aepb Commonly known as: ADVAIR Inhale 1 puff into the lungs 2 (two) times daily.   furosemide 20 MG tablet Commonly known as: LASIX Take 1 tablet (20 mg total) by mouth daily. Start taking on: January 26, 2021 What changed: These instructions start on January 26, 2021. If you are unsure what to do until then, ask your doctor or other care provider.   HYDROcodone-acetaminophen 10-325 MG tablet Commonly known as: NORCO Take 1 tablet by mouth 4 (four) times daily as needed for moderate pain.   hydrOXYzine 25 MG tablet Commonly known as: ATARAX/VISTARIL Take 25 mg by mouth at bedtime as  needed for anxiety (sleeping).   loperamide 2 MG capsule Commonly known as: IMODIUM Take 1 capsule (2 mg total) by mouth every 4 (four) hours as needed for diarrhea or loose stools.   lovastatin 40 MG tablet Commonly known as: MEVACOR Take 40 mg by mouth daily.   metoprolol succinate 50 MG 24 hr tablet Commonly known as: TOPROL-XL Take 1 tablet (50 mg total) by mouth daily for 30 days. What changed: Another medication with the same name was added. Make sure you understand how and when to take each.   metoprolol succinate 25 MG 24 hr tablet Commonly known as: TOPROL-XL Take 1 tablet (25 mg total) by mouth daily. What changed: You were already taking a medication with the same name, and this prescription was added. Make sure you understand how and when to take each.   sitaGLIPtin 100 MG tablet Commonly known as: JANUVIA Take 100 mg by mouth daily.   TRIAMCINOLONE ACETONIDE (TOP) 0.05 % Oint Apply 1 application topically 2 (two) times daily as needed (legs).   Vitamin D (Ergocalciferol) 1.25 MG (50000 UNIT) Caps capsule Commonly known as: DRISDOL Take 50,000 Units by mouth every Sunday.       Consultations:  None  Procedures/Studies:  2D Echo on 01/17/2021 1. Left ventricular ejection fraction, by estimation, is 55 to 60%. The  left ventricle has normal function. The left ventricle has no regional  wall motion abnormalities. There is mild concentric left ventricular  hypertrophy. Left ventricular diastolic  parameters are consistent with Grade I diastolic dysfunction (impaired  relaxation). Elevated left atrial pressure.  2. Right ventricular systolic function is normal. The right ventricular  size is normal. There is normal pulmonary artery systolic pressure.  3. The mitral valve is normal in structure. No evidence of mitral valve  regurgitation. No evidence of mitral stenosis.  4. The aortic valve is normal in structure. There is severe calcifcation  of the aortic  valve. There is severe thickening of the aortic valve.  Aortic valve regurgitation is mild to moderate. Moderate aortic valve  stenosis. Aortic valve mean gradient  measures 16.0 mmHg.  5. The inferior vena cava is normal in size with greater than 50%  respiratory variability, suggesting right atrial pressure of 3 mmHg.    CT Head Wo Contrast  Result Date: 01/16/2021 CLINICAL DATA:  Head trauma. EXAM: CT HEAD WITHOUT CONTRAST CT MAXILLOFACIAL WITHOUT CONTRAST CT CERVICAL SPINE WITHOUT CONTRAST TECHNIQUE: Multidetector CT imaging of the head, cervical spine, and maxillofacial structures were performed using the standard protocol without intravenous contrast. Multiplanar CT image reconstructions of the cervical spine and maxillofacial structures were also generated. COMPARISON:  December 31, 2020. FINDINGS: CT HEAD FINDINGS Brain: No evidence of  acute large vascular territory infarction, hemorrhage, hydrocephalus, extra-axial collection or mass lesion/mass effect. Similar patchy white matter hypoattenuation, likely relating to chronic microvascular ischemic disease. Vascular: Calcific atherosclerosis. Skull: No acute fracture. Other: No mastoid effusions. CT MAXILLOFACIAL FINDINGS Osseous: No fracture or mandibular dislocation. No destructive process. Orbits: Negative. No traumatic or inflammatory finding. Sinuses: Inferior left maxillary sinus in scattered ethmoid air cell mucosal thickening. Trace frothy secretions in the right sphenoid sinus. Soft tissues: Negative. CT CERVICAL SPINE FINDINGS Alignment: Similar alignment comparison to prior, including 2 mm of anterolisthesis of C4 on C5 and trace retrolisthesis of C5 on C6. Skull base and vertebrae: No evidence acute fracture. Vertebral body heights are maintained. Osteopenia. Soft tissues and spinal canal: No prevertebral fluid or swelling. No visible canal hematoma. Disc levels: Multilevel intervertebral degenerative change with disc height loss most  pronounced at C5-C6 and C6-C7. Multilevel right greater than left facet hypertrophy. Upper chest: Visualized lung apices are clear. Other: Bilateral carotid atherosclerosis. IMPRESSION: CT head: 1. No evidence of acute intra cranial abnormality. 2. Similar chronic microvascular ischemic disease and generalized cerebral volume loss. CT maxillofacial: 1. No acute fracture. CT cervical spine: 1. No acute fracture or traumatic malalignment. 2. Similar multilevel degenerative disc disease and facet arthropathy. Electronically Signed   By: Margaretha Sheffield MD   On: 01/16/2021 20:40   CT Cervical Spine Wo Contrast  Result Date: 01/16/2021 CLINICAL DATA:  Head trauma. EXAM: CT HEAD WITHOUT CONTRAST CT MAXILLOFACIAL WITHOUT CONTRAST CT CERVICAL SPINE WITHOUT CONTRAST TECHNIQUE: Multidetector CT imaging of the head, cervical spine, and maxillofacial structures were performed using the standard protocol without intravenous contrast. Multiplanar CT image reconstructions of the cervical spine and maxillofacial structures were also generated. COMPARISON:  December 31, 2020. FINDINGS: CT HEAD FINDINGS Brain: No evidence of acute large vascular territory infarction, hemorrhage, hydrocephalus, extra-axial collection or mass lesion/mass effect. Similar patchy white matter hypoattenuation, likely relating to chronic microvascular ischemic disease. Vascular: Calcific atherosclerosis. Skull: No acute fracture. Other: No mastoid effusions. CT MAXILLOFACIAL FINDINGS Osseous: No fracture or mandibular dislocation. No destructive process. Orbits: Negative. No traumatic or inflammatory finding. Sinuses: Inferior left maxillary sinus in scattered ethmoid air cell mucosal thickening. Trace frothy secretions in the right sphenoid sinus. Soft tissues: Negative. CT CERVICAL SPINE FINDINGS Alignment: Similar alignment comparison to prior, including 2 mm of anterolisthesis of C4 on C5 and trace retrolisthesis of C5 on C6. Skull base and  vertebrae: No evidence acute fracture. Vertebral body heights are maintained. Osteopenia. Soft tissues and spinal canal: No prevertebral fluid or swelling. No visible canal hematoma. Disc levels: Multilevel intervertebral degenerative change with disc height loss most pronounced at C5-C6 and C6-C7. Multilevel right greater than left facet hypertrophy. Upper chest: Visualized lung apices are clear. Other: Bilateral carotid atherosclerosis. IMPRESSION: CT head: 1. No evidence of acute intra cranial abnormality. 2. Similar chronic microvascular ischemic disease and generalized cerebral volume loss. CT maxillofacial: 1. No acute fracture. CT cervical spine: 1. No acute fracture or traumatic malalignment. 2. Similar multilevel degenerative disc disease and facet arthropathy. Electronically Signed   By: Margaretha Sheffield MD   On: 01/16/2021 20:40   DG Chest Port 1 View  Result Date: 01/16/2021 CLINICAL DATA:  87 year old female with cough. EXAM: PORTABLE CHEST 1 VIEW COMPARISON:  Chest radiograph dated 12/31/2020 FINDINGS: No focal consolidation, pleural effusion, pneumothorax. The cardiac silhouette is within limits. Atherosclerotic calcification of the aorta. Osteopenia with degenerative changes of the spine. No acute osseous pathology. IMPRESSION: No active disease. Electronically Signed  By: Anner Crete M.D.   On: 01/16/2021 18:04   ECHOCARDIOGRAM COMPLETE  Result Date: 01/17/2021    ECHOCARDIOGRAM REPORT   Patient Name:   Stefanie Phelps Date of Exam: 01/17/2021 Medical Rec #:  [id number]     Height:       64.0 in Accession #:    [id number]    Weight:       120.7 lb Date of Birth:  1930/04/11     BSA:          1.579 m Patient Age:    87 years      BP:           147/65 mmHg Patient Gender: F             HR:           62 bpm. Exam Location:  Inpatient Procedure: 2D Echo, Cardiac Doppler and Color Doppler Indications:    I35.0 Nonrheumatic aortic (valve) stenosis  History:        Patient has prior history of  Echocardiogram examinations, most                 recent 03/18/2019. COPD; Risk Factors:Hypertension and Diabetes.  Sonographer:    Tiffany Dance Referring Phys: [id number] Charlesetta Ivory Graceanna Theissen IMPRESSIONS  1. Left ventricular ejection fraction, by estimation, is 55 to 60%. The left ventricle has normal function. The left ventricle has no regional wall motion abnormalities. There is mild concentric left ventricular hypertrophy. Left ventricular diastolic parameters are consistent with Grade I diastolic dysfunction (impaired relaxation). Elevated left atrial pressure.  2. Right ventricular systolic function is normal. The right ventricular size is normal. There is normal pulmonary artery systolic pressure.  3. The mitral valve is normal in structure. No evidence of mitral valve regurgitation. No evidence of mitral stenosis.  4. The aortic valve is normal in structure. There is severe calcifcation of the aortic valve. There is severe thickening of the aortic valve. Aortic valve regurgitation is mild to moderate. Moderate aortic valve stenosis. Aortic valve mean gradient measures 16.0 mmHg.  5. The inferior vena cava is normal in size with greater than 50% respiratory variability, suggesting right atrial pressure of 3 mmHg. Comparison(s): No significant change from prior study. FINDINGS  Left Ventricle: Left ventricular ejection fraction, by estimation, is 55 to 60%. The left ventricle has normal function. The left ventricle has no regional wall motion abnormalities. The left ventricular internal cavity size was normal in size. There is  mild concentric left ventricular hypertrophy. Left ventricular diastolic parameters are consistent with Grade I diastolic dysfunction (impaired relaxation). Elevated left atrial pressure. Right Ventricle: The right ventricular size is normal. No increase in right ventricular wall thickness. Right ventricular systolic function is normal. There is normal pulmonary artery systolic pressure. The  tricuspid regurgitant velocity is 2.52 m/s, and  with an assumed right atrial pressure of 3 mmHg, the estimated right ventricular systolic pressure is 17.5 mmHg. Left Atrium: Left atrial size was normal in size. Right Atrium: Right atrial size was normal in size. Pericardium: There is no evidence of pericardial effusion. Mitral Valve: The mitral valve is normal in structure. There is moderate thickening of the mitral valve leaflet(s). There is moderate calcification of the mitral valve leaflet(s). Mild mitral annular calcification. No evidence of mitral valve regurgitation. No evidence of mitral valve stenosis. Tricuspid Valve: The tricuspid valve is normal in structure. Tricuspid valve regurgitation is mild . No evidence of tricuspid stenosis. Aortic Valve: The aortic  valve is normal in structure. There is severe calcifcation of the aortic valve. There is severe thickening of the aortic valve. Aortic valve regurgitation is mild to moderate. Aortic regurgitation PHT measures 685 msec. Moderate aortic stenosis is present. Aortic valve mean gradient measures 16.0 mmHg. Aortic valve peak gradient measures 29.9 mmHg. Aortic valve area, by VTI measures 0.67 cm. Pulmonic Valve: The pulmonic valve was normal in structure. Pulmonic valve regurgitation is not visualized. No evidence of pulmonic stenosis. Aorta: The aortic root is normal in size and structure. Venous: The inferior vena cava is normal in size with greater than 50% respiratory variability, suggesting right atrial pressure of 3 mmHg. IAS/Shunts: No atrial level shunt detected by color flow Doppler.  LEFT VENTRICLE PLAX 2D LVIDd:         4.30 cm  Diastology LVIDs:         2.90 cm  LV e' medial:    5.04 cm/s LV PW:         1.00 cm  LV E/e' medial:  14.7 LV IVS:        0.90 cm  LV e' lateral:   6.15 cm/s LVOT diam:     1.70 cm  LV E/e' lateral: 12.0 LV SV:         43 LV SV Index:   27 LVOT Area:     2.27 cm  RIGHT VENTRICLE             IVC RV Basal diam:  2.60 cm      IVC diam: 1.60 cm RV S prime:     17.70 cm/s TAPSE (M-mode): 1.7 cm LEFT ATRIUM             Index       RIGHT ATRIUM           Index LA diam:        3.60 cm 2.28 cm/m  RA Area:     11.10 cm LA Vol (A2C):   60.3 ml 38.19 ml/m RA Volume:   26.80 ml  16.98 ml/m LA Vol (A4C):   57.5 ml 36.42 ml/m LA Biplane Vol: 60.5 ml 38.32 ml/m  AORTIC VALVE AV Area (Vmax):    0.72 cm AV Area (Vmean):   0.64 cm AV Area (VTI):     0.67 cm AV Vmax:           273.50 cm/s AV Vmean:          189.500 cm/s AV VTI:            0.641 m AV Peak Grad:      29.9 mmHg AV Mean Grad:      16.0 mmHg LVOT Vmax:         86.40 cm/s LVOT Vmean:        53.700 cm/s LVOT VTI:          0.189 m LVOT/AV VTI ratio: 0.29 AI PHT:            685 msec  AORTA Ao Root diam: 3.60 cm MITRAL VALVE                TRICUSPID VALVE MV Area (PHT): 2.09 cm     TR Peak grad:   25.4 mmHg MV Decel Time: 363 msec     TR Vmax:        252.00 cm/s MV E velocity: 74.10 cm/s MV A velocity: 111.00 cm/s  SHUNTS MV E/A ratio:  0.67  Systemic VTI:  0.19 m                             Systemic Diam: 1.70 cm Ena Dawley MD Electronically signed by Ena Dawley MD Signature Date/Time: 01/17/2021/2:00:51 PM    Final    CT Maxillofacial WO CM  Result Date: 01/16/2021 CLINICAL DATA:  Head trauma. EXAM: CT HEAD WITHOUT CONTRAST CT MAXILLOFACIAL WITHOUT CONTRAST CT CERVICAL SPINE WITHOUT CONTRAST TECHNIQUE: Multidetector CT imaging of the head, cervical spine, and maxillofacial structures were performed using the standard protocol without intravenous contrast. Multiplanar CT image reconstructions of the cervical spine and maxillofacial structures were also generated. COMPARISON:  December 31, 2020. FINDINGS: CT HEAD FINDINGS Brain: No evidence of acute large vascular territory infarction, hemorrhage, hydrocephalus, extra-axial collection or mass lesion/mass effect. Similar patchy white matter hypoattenuation, likely relating to chronic microvascular ischemic disease.  Vascular: Calcific atherosclerosis. Skull: No acute fracture. Other: No mastoid effusions. CT MAXILLOFACIAL FINDINGS Osseous: No fracture or mandibular dislocation. No destructive process. Orbits: Negative. No traumatic or inflammatory finding. Sinuses: Inferior left maxillary sinus in scattered ethmoid air cell mucosal thickening. Trace frothy secretions in the right sphenoid sinus. Soft tissues: Negative. CT CERVICAL SPINE FINDINGS Alignment: Similar alignment comparison to prior, including 2 mm of anterolisthesis of C4 on C5 and trace retrolisthesis of C5 on C6. Skull base and vertebrae: No evidence acute fracture. Vertebral body heights are maintained. Osteopenia. Soft tissues and spinal canal: No prevertebral fluid or swelling. No visible canal hematoma. Disc levels: Multilevel intervertebral degenerative change with disc height loss most pronounced at C5-C6 and C6-C7. Multilevel right greater than left facet hypertrophy. Upper chest: Visualized lung apices are clear. Other: Bilateral carotid atherosclerosis. IMPRESSION: CT head: 1. No evidence of acute intra cranial abnormality. 2. Similar chronic microvascular ischemic disease and generalized cerebral volume loss. CT maxillofacial: 1. No acute fracture. CT cervical spine: 1. No acute fracture or traumatic malalignment. 2. Similar multilevel degenerative disc disease and facet arthropathy. Electronically Signed   By: Margaretha Sheffield MD   On: 01/16/2021 20:40       The results of significant diagnostics from this hospitalization (including imaging, microbiology, ancillary and laboratory) are listed below for reference.     Microbiology: Recent Results (from the past 240 hour(s))  Culture, blood (routine x 2)     Status: None (Preliminary result)   Collection Time: 01/16/21  8:08 PM   Specimen: BLOOD RIGHT ARM  Result Value Ref Range Status   Specimen Description BLOOD RIGHT ARM  Final   Special Requests   Final    BOTTLES DRAWN AEROBIC AND  ANAEROBIC Blood Culture adequate volume   Culture   Final    NO GROWTH 3 DAYS Performed at [facility], [address] [place], [place] [address]    Report Status PENDING  Incomplete  Urine culture     Status: Abnormal   Collection Time: 01/16/21 10:55 PM   Specimen: Urine, Random  Result Value Ref Range Status   Specimen Description URINE, RANDOM  Final   Special Requests   Final    NONE Performed at [facility], [place] [address], [place],  [address]    Culture MULTIPLE SPECIES PRESENT, SUGGEST RECOLLECTION (A)  Final   Report Status 01/18/2021 FINAL  Final  SARS CORONAVIRUS 2 (TAT 6-24 HRS) Nasopharyngeal Nasopharyngeal Swab     Status: None   Collection Time: 01/17/21 12:23 AM   Specimen: Nasopharyngeal  Swab  Result Value Ref Range Status   SARS Coronavirus 2 NEGATIVE NEGATIVE Final    Comment: (NOTE) SARS-CoV-2 target nucleic acids are NOT DETECTED.  The SARS-CoV-2 RNA is generally detectable in upper and lower respiratory specimens during the acute phase of infection. Negative results do not preclude SARS-CoV-2 infection, do not rule out co-infections with other pathogens, and should not be used as the sole basis for treatment or other patient management decisions. Negative results must be combined with clinical observations, patient history, and epidemiological information. The expected result is Negative.  Fact Sheet for Patients: SugarRoll.be  Fact Sheet for Healthcare Providers: https://www.woods-mathews.com/  This test is not yet approved or cleared by the [place] FDA and  has been authorized for detection and/or diagnosis of SARS-CoV-2 by FDA under an Emergency Use Authorization (EUA). This EUA will remain  in effect (meaning this test can be used) for the duration of the COVID-19 declaration under Se ction 564(b)(1) of the Act, 21 U.S.C. section 360bbb-3(b)(1), unless the authorization is  terminated or revoked sooner.  Performed at [facility], [place] [address] [place], [place] [address]   C Difficile Quick Screen w PCR reflex     Status: None   Collection Time: 01/17/21  8:04 AM   Specimen: STOOL  Result Value Ref Range Status   C Diff antigen NEGATIVE NEGATIVE Final   C Diff toxin NEGATIVE NEGATIVE Final   C Diff interpretation No C. difficile detected.  Final    Comment: Performed at [facility], [place] [address] [place], [place] [address]     Labs:  CBC: Recent Labs  Lab 01/16/21 1746 01/17/21 0435 01/18/21 0354  WBC 5.3 5.4 5.1  NEUTROABS 2.7  --   --   HGB 10.0* 9.5* 9.2*  HCT 32.2* 30.9* 27.9*  MCV 101.6* 102.3* 96.9  PLT 288 246 238   BMP &GFR Recent Labs  Lab 01/16/21 1746 01/16/21 2036 01/17/21 0435 01/18/21 0354 01/19/21 0206  NA 139  --  142 142 140  K 2.9*  --  3.8 3.4* 4.5  CL 109  --  113* 114* 112*  CO2 19*  --  16* 20* 21*  GLUCOSE 102*  --  80 89 89  BUN 42*  --  38* 25* 19  CREATININE 1.69*  --  1.46* 1.13* 1.10*  CALCIUM 8.7*  --  8.7* 8.7* 8.4*  MG  --  2.1  --  1.7 1.8  PHOS  --   --   --  1.3* 2.7   Estimated Creatinine Clearance: 28.4 mL/min (A) (by C-G formula based on SCr of 1.1 mg/dL (H)). Liver & Pancreas: Recent Labs  Lab 01/16/21 1746 01/18/21 0354 01/19/21 0206  AST 24  --   --   ALT 13  --   --   ALKPHOS 44  --   --   BILITOT 1.3*  --   --   PROT 6.5  --   --   ALBUMIN 2.7* 2.2* 2.2*   No results for input(s): LIPASE, AMYLASE in the last 168 hours. No results for input(s): AMMONIA in the last 168 hours. Diabetic: Recent Labs    01/17/21 0435  HGBA1C 5.8*   Recent Labs  Lab 01/18/21 1109 01/18/21 1627 01/18/21 2240 01/19/21 0617 01/19/21 1154  GLUCAP 129* 101* 82 79 77   Cardiac Enzymes: No results for input(s): CKTOTAL, CKMB, CKMBINDEX, TROPONINI in the last 168 hours. No results for input(s): PROBNP in the last  8760 hours. Coagulation Profile: No results for input(s): INR,  PROTIME in the last 168 hours. Thyroid Function Tests: Recent Labs    01/17/21 0800  TSH 0.358   Lipid Profile: No results for input(s): CHOL, HDL, LDLCALC, TRIG, CHOLHDL, LDLDIRECT in the last 72 hours. Anemia Panel: Recent Labs    01/18/21 0354  VITAMINB12 2,339*  FOLATE 10.2  FERRITIN 1,267*  TIBC 129*  IRON 67  RETICCTPCT 0.6   Urine analysis:    Component Value Date/Time   COLORURINE YELLOW 01/16/2021 2254   APPEARANCEUR HAZY (A) 01/16/2021 2254   LABSPEC 1.017 01/16/2021 2254   PHURINE 5.0 01/16/2021 2254   GLUCOSEU NEGATIVE 01/16/2021 2254   HGBUR MODERATE (A) 01/16/2021 2254   BILIRUBINUR NEGATIVE 01/16/2021 2254   KETONESUR 5 (A) 01/16/2021 2254   PROTEINUR 30 (A) 01/16/2021 2254   NITRITE NEGATIVE 01/16/2021 2254   LEUKOCYTESUR LARGE (A) 01/16/2021 2254   Sepsis Labs: Invalid input(s): PROCALCITONIN, LACTICIDVEN   Time coordinating discharge: 45 minutes  SIGNED:  Mercy Riding, MD  [facility] 01/19/2021, 3:09 PM  If 7PM-7AM, please contact night-coverage [email or web address]

## 2021-01-19 NOTE — Telephone Encounter (Signed)
01/19/21 Spoke with grandson and r/s appts

## 2021-01-21 LAB — CULTURE, BLOOD (ROUTINE X 2)
Culture: NO GROWTH
Special Requests: ADEQUATE

## 2021-01-28 DIAGNOSIS — E860 Dehydration: Secondary | ICD-10-CM | POA: Diagnosis not present

## 2021-01-28 DIAGNOSIS — D469 Myelodysplastic syndrome, unspecified: Secondary | ICD-10-CM | POA: Diagnosis not present

## 2021-01-28 DIAGNOSIS — J449 Chronic obstructive pulmonary disease, unspecified: Secondary | ICD-10-CM | POA: Diagnosis not present

## 2021-01-28 DIAGNOSIS — D631 Anemia in chronic kidney disease: Secondary | ICD-10-CM | POA: Diagnosis not present

## 2021-01-28 DIAGNOSIS — E1122 Type 2 diabetes mellitus with diabetic chronic kidney disease: Secondary | ICD-10-CM | POA: Diagnosis not present

## 2021-01-28 DIAGNOSIS — I5032 Chronic diastolic (congestive) heart failure: Secondary | ICD-10-CM | POA: Diagnosis not present

## 2021-01-28 DIAGNOSIS — I110 Hypertensive heart disease with heart failure: Secondary | ICD-10-CM | POA: Diagnosis not present

## 2021-01-28 DIAGNOSIS — R197 Diarrhea, unspecified: Secondary | ICD-10-CM | POA: Diagnosis not present

## 2021-01-28 DIAGNOSIS — N1831 Chronic kidney disease, stage 3a: Secondary | ICD-10-CM | POA: Diagnosis not present

## 2021-01-29 ENCOUNTER — Other Ambulatory Visit: Payer: Self-pay

## 2021-01-29 ENCOUNTER — Other Ambulatory Visit: Payer: Self-pay | Admitting: Hematology and Oncology

## 2021-01-29 ENCOUNTER — Other Ambulatory Visit: Payer: Self-pay | Admitting: Oncology

## 2021-01-29 ENCOUNTER — Inpatient Hospital Stay: Payer: Medicare PPO | Attending: Oncology

## 2021-01-29 ENCOUNTER — Inpatient Hospital Stay (INDEPENDENT_AMBULATORY_CARE_PROVIDER_SITE_OTHER): Payer: Medicare PPO | Admitting: Oncology

## 2021-01-29 ENCOUNTER — Telehealth: Payer: Self-pay | Admitting: Oncology

## 2021-01-29 VITALS — BP 133/63 | HR 60 | Temp 98.2°F | Resp 14 | Ht 64.0 in | Wt 110.0 lb

## 2021-01-29 DIAGNOSIS — D461 Refractory anemia with ring sideroblasts: Secondary | ICD-10-CM | POA: Insufficient documentation

## 2021-01-29 DIAGNOSIS — I110 Hypertensive heart disease with heart failure: Secondary | ICD-10-CM | POA: Diagnosis not present

## 2021-01-29 DIAGNOSIS — N1831 Chronic kidney disease, stage 3a: Secondary | ICD-10-CM | POA: Diagnosis not present

## 2021-01-29 DIAGNOSIS — D469 Myelodysplastic syndrome, unspecified: Secondary | ICD-10-CM

## 2021-01-29 DIAGNOSIS — J449 Chronic obstructive pulmonary disease, unspecified: Secondary | ICD-10-CM | POA: Diagnosis not present

## 2021-01-29 DIAGNOSIS — D649 Anemia, unspecified: Secondary | ICD-10-CM | POA: Diagnosis not present

## 2021-01-29 DIAGNOSIS — E860 Dehydration: Secondary | ICD-10-CM | POA: Diagnosis not present

## 2021-01-29 DIAGNOSIS — R197 Diarrhea, unspecified: Secondary | ICD-10-CM | POA: Diagnosis not present

## 2021-01-29 DIAGNOSIS — I5032 Chronic diastolic (congestive) heart failure: Secondary | ICD-10-CM | POA: Diagnosis not present

## 2021-01-29 DIAGNOSIS — D631 Anemia in chronic kidney disease: Secondary | ICD-10-CM | POA: Diagnosis not present

## 2021-01-29 DIAGNOSIS — E1122 Type 2 diabetes mellitus with diabetic chronic kidney disease: Secondary | ICD-10-CM | POA: Diagnosis not present

## 2021-01-29 LAB — BASIC METABOLIC PANEL
BUN: 19 (ref 4–21)
CO2: 26 — AB (ref 13–22)
Chloride: 111 — AB (ref 99–108)
Creatinine: 1.1 (ref 0.5–1.1)
Glucose: 112
Potassium: 3.9 (ref 3.4–5.3)
Sodium: 142 (ref 137–147)

## 2021-01-29 LAB — HEPATIC FUNCTION PANEL
ALT: 15 (ref 7–35)
AST: 33 (ref 13–35)
Alkaline Phosphatase: 70 (ref 25–125)
Bilirubin, Total: 0.8

## 2021-01-29 LAB — CBC AND DIFFERENTIAL
HCT: 25 — AB (ref 36–46)
Hemoglobin: 7.9 — AB (ref 12.0–16.0)
Neutrophils Absolute: 2.4
Platelets: 215 (ref 150–399)
WBC: 4.8

## 2021-01-29 LAB — CORRECTED CALCIUM (CC13): Calcium, Corrected: 9.6

## 2021-01-29 LAB — COMPREHENSIVE METABOLIC PANEL
Albumin: 3.3 — AB (ref 3.5–5.0)
Calcium: 8.9 (ref 8.7–10.7)

## 2021-01-29 LAB — CBC
MCV: 101 — AB (ref 81–99)
RBC: 2.45 — AB (ref 3.87–5.11)

## 2021-01-29 NOTE — Progress Notes (Signed)
[facility]  [address] [place],  [place]  [address] [phone]  Clinic Day:  01/29/2021  Referring physician: Penelope Coop, FNP   HISTORY OF PRESENT ILLNESS:  The patient is a 87 y.o. female with myelodysplasia-refractory anemia with ring sideroblasts.  The patient has been taking luspatercept over these past months, which has been effective in keeping her hemoglobin above 10.  Her last dose was given on December 13th.  A CBC in January 2022 showed her hemoglobin to be 11.2.  She comes in today to reassess her myelodysplasia.  Since her last visit, the patient has not been doing fairly well.   She has been very weak over the past few weeks to where she has fallen, which led to significant facial bruising.  She has been more reliant upon her family members to help her with her activities of daily living.  She denies having any overt forms of blood loss.   However, she clearly recognizes that her health has been gradually declining over these past handful of months.  PHYSICAL EXAM:  Blood pressure 133/63, pulse 60, temperature 98.2 F (36.8 C), resp. rate 14, height '5\' 4"'  (1.626 m), weight 110 lb (49.9 kg), SpO2 95 %. Wt Readings from Last 3 Encounters:  01/29/21 110 lb (49.9 kg)  01/19/21 112 lb 1.6 oz (50.8 kg)  11/24/20 120 lb 12 oz (54.8 kg)   Body mass index is 18.88 kg/m. Performance status (ECOG): 3 Physical Exam Constitutional:      Appearance: Normal appearance. She is ill-appearing (Chronically ill-appearing woman who is in a wheelchair).  HENT:     Mouth/Throat:     Mouth: Mucous membranes are moist.     Pharynx: Oropharynx is clear. No oropharyngeal exudate or posterior oropharyngeal erythema.  Cardiovascular:     Rate and Rhythm: Normal rate and regular rhythm.     Heart sounds: No murmur heard. No friction rub. No gallop.   Pulmonary:     Effort: Pulmonary effort is normal. No respiratory distress.     Breath sounds: Normal  breath sounds. No wheezing, rhonchi or rales.  Chest:  Breasts:     Right: No axillary adenopathy or supraclavicular adenopathy.     Left: No axillary adenopathy or supraclavicular adenopathy.    Abdominal:     General: Bowel sounds are normal. There is no distension.     Palpations: Abdomen is soft. There is no mass.     Tenderness: There is no abdominal tenderness.  Musculoskeletal:        General: No swelling.     Right lower leg: No edema.     Left lower leg: No edema.  Lymphadenopathy:     Cervical: No cervical adenopathy.     Upper Body:     Right upper body: No supraclavicular or axillary adenopathy.     Left upper body: No supraclavicular or axillary adenopathy.     Lower Body: No right inguinal adenopathy. No left inguinal adenopathy.  Skin:    General: Skin is warm.     Coloration: Skin is not jaundiced.     Findings: Bruising (left face and forearms show old bruises) present. No lesion or rash.  Neurological:     General: No focal deficit present.     Mental Status: She is alert and oriented to person, place, and time. Mental status is at baseline.     Cranial Nerves: Cranial nerves are intact.  Psychiatric:  Mood and Affect: Mood normal.        Behavior: Behavior normal.        Thought Content: Thought content normal.     LABS:   CBC Latest Ref Rng & Units 01/29/2021 01/18/2021 01/17/2021  WBC - 4.8 5.1 5.4  Hemoglobin 12.0 - 16.0 7.9(A) 9.2(L) 9.5(L)  Hematocrit 36 - 46 25(A) 27.9(L) 30.9(L)  Platelets 150 - 399 215 238 246   CMP Latest Ref Rng & Units 01/29/2021 01/19/2021 01/18/2021  Glucose 70 - 99 mg/dL - 89 89  BUN 4 - '21 19 19 ' 25(H)  Creatinine 0.5 - 1.1 1.1 1.10(H) 1.13(H)  Sodium 137 - 147 142 140 142  Potassium 3.4 - 5.3 3.9 4.5 3.4(L)  Chloride 99 - 108 111(A) 112(H) 114(H)  CO2 13 - 22 26(A) 21(L) 20(L)  Calcium 8.7 - 10.7 8.9 8.4(L) 8.7(L)  Total Protein 6.5 - 8.1 g/dL - - -  Total Bilirubin 0.3 - 1.2 mg/dL - - -  Alkaline Phos 25 - 125 70 -  -  AST 13 - 35 33 - -  ALT 7 - 35 15 - -    ASSESSMENT & PLAN:  Assessment/Plan:  An 87 y.o. female with myelodysplasia-RARS.  When evaluating her hemoglobin today, it has significantly fallen over the past month.  As mentioned previously, she has not had Luspatercept in over 2 months.  She will restart luspatercept injections every 3 weeks to get her hemoglobin back to or above 10.  Her CBC will continue to be checked every 3 weeks when she receives each Luspatercept injection.  I will see her back in 9 weeks for repeat clinical assessment.  The patient understands all the plans discussed today and is in agreement with them.       Mason Dibiasio Macarthur Critchley, MD

## 2021-01-30 ENCOUNTER — Inpatient Hospital Stay: Payer: Medicare PPO

## 2021-01-30 VITALS — BP 138/52 | HR 54 | Temp 97.8°F | Resp 18 | Ht 64.0 in | Wt 117.0 lb

## 2021-01-30 DIAGNOSIS — D461 Refractory anemia with ring sideroblasts: Secondary | ICD-10-CM

## 2021-01-30 MED ORDER — LUSPATERCEPT-AAMT 75 MG ~~LOC~~ SOLR
1.9000 mg/kg | Freq: Once | SUBCUTANEOUS | Status: AC
  Administered 2021-01-30: 100 mg via SUBCUTANEOUS
  Filled 2021-01-30: qty 1.5

## 2021-01-30 NOTE — Patient Instructions (Signed)
Luspatercept injection What is this medicine? LUSPATERCEPT (lus PAT er sept) helps your body make more red blood cells. This medicine is used to treat anemia caused by beta thalassemia or myelodysplastic syndromes. This medicine may be used for other purposes; ask your health care provider or pharmacist if you have questions. COMMON BRAND NAME(S): REBLOZYL What should I tell my health care provider before I take this medicine? They need to know if you have any of these conditions:  cigarette smoker  have had your spleen removed  high blood pressure  history of blood clots  an unusual or allergic reaction to luspatercept, other medicines, foods, dyes or preservatives  pregnant or trying to get pregnant  breast-feeding How should I use this medicine? This medicine is for injection under the skin. It is given by a healthcare professional in a hospital or clinic setting. Talk to your pediatrician about the use of the medicine in children. This medicine is not approved for use in children. Overdosage: If you think you have taken too much of this medicine contact a poison control center or emergency room at once. NOTE: This medicine is only for you. Do not share this medicine with others. What if I miss a dose? Keep appointments for follow-up doses. It is important not to miss your dose. Call your doctor or healthcare professional if you are unable to keep an appointment. What may interact with this medicine? Interactions are not expected. This list may not describe all possible interactions. Give your health care provider a list of all the medicines, herbs, non-prescription drugs, or dietary supplements you use. Also tell them if you smoke, drink alcohol, or use illegal drugs. Some items may interact with your medicine. What should I watch for while using this medicine? Your condition will be monitored carefully while you are receiving this medicine. Do not become pregnant while taking  this medicine or for 3 months after stopping it. Women should inform their healthcare professional if they wish to become pregnant or think they might be pregnant. There is a potential for serious side effects and harm to an unborn child. Talk to your healthcare professional for more information. Do not breast-feed an infant while taking this medicine. You may need blood work done while you are taking this medicine. What side effects may I notice from receiving this medicine? Side effects that you should report to your doctor or health care professional as soon as possible:  allergic reactions like skin rash, itching or hives; swelling of the face, lips, or tongue  signs and symptoms of a blood clot such as chest pain; shortness of breath; pain, swelling, or warmth in the leg  signs and symptoms of a stroke like changes in vision; confusion; trouble speaking or understanding; severe headaches; sudden numbness or weakness of the face, arm or leg; trouble walking; dizziness; loss of balance or coordination Side effects that usually do not require medical attention (report these to your doctor or health care professional if they continue or are bothersome):  cough  diarrhea  dizziness  headache  joint pain  stomach pain  tiredness This list may not describe all possible side effects. Call your doctor for medical advice about side effects. You may report side effects to FDA at 1-800-FDA-1088. Where should I keep my medicine? This medicine is given in a hospital or clinic and will not be stored at home. NOTE: This sheet is a summary. It may not cover all possible information. If you have questions about   this medicine, talk to your doctor, pharmacist, or health care provider.  2021 Elsevier/Gold Standard (2019-03-20 15:57:59)

## 2021-02-02 NOTE — Progress Notes (Signed)
Spoke with patient and we re-enrolled her into the [facility] program. 02/02/21-02/02/22  ID# [id number]

## 2021-02-04 DIAGNOSIS — D631 Anemia in chronic kidney disease: Secondary | ICD-10-CM | POA: Diagnosis not present

## 2021-02-04 DIAGNOSIS — J449 Chronic obstructive pulmonary disease, unspecified: Secondary | ICD-10-CM | POA: Diagnosis not present

## 2021-02-04 DIAGNOSIS — D469 Myelodysplastic syndrome, unspecified: Secondary | ICD-10-CM | POA: Diagnosis not present

## 2021-02-04 DIAGNOSIS — I5032 Chronic diastolic (congestive) heart failure: Secondary | ICD-10-CM | POA: Diagnosis not present

## 2021-02-04 DIAGNOSIS — I110 Hypertensive heart disease with heart failure: Secondary | ICD-10-CM | POA: Diagnosis not present

## 2021-02-04 DIAGNOSIS — E860 Dehydration: Secondary | ICD-10-CM | POA: Diagnosis not present

## 2021-02-04 DIAGNOSIS — R197 Diarrhea, unspecified: Secondary | ICD-10-CM | POA: Diagnosis not present

## 2021-02-04 DIAGNOSIS — E1122 Type 2 diabetes mellitus with diabetic chronic kidney disease: Secondary | ICD-10-CM | POA: Diagnosis not present

## 2021-02-04 DIAGNOSIS — N1831 Chronic kidney disease, stage 3a: Secondary | ICD-10-CM | POA: Diagnosis not present

## 2021-02-05 DIAGNOSIS — N1831 Chronic kidney disease, stage 3a: Secondary | ICD-10-CM | POA: Diagnosis not present

## 2021-02-05 DIAGNOSIS — R197 Diarrhea, unspecified: Secondary | ICD-10-CM | POA: Diagnosis not present

## 2021-02-05 DIAGNOSIS — D631 Anemia in chronic kidney disease: Secondary | ICD-10-CM | POA: Diagnosis not present

## 2021-02-05 DIAGNOSIS — E860 Dehydration: Secondary | ICD-10-CM | POA: Diagnosis not present

## 2021-02-05 DIAGNOSIS — E1122 Type 2 diabetes mellitus with diabetic chronic kidney disease: Secondary | ICD-10-CM | POA: Diagnosis not present

## 2021-02-05 DIAGNOSIS — I5032 Chronic diastolic (congestive) heart failure: Secondary | ICD-10-CM | POA: Diagnosis not present

## 2021-02-05 DIAGNOSIS — J449 Chronic obstructive pulmonary disease, unspecified: Secondary | ICD-10-CM | POA: Diagnosis not present

## 2021-02-05 DIAGNOSIS — I110 Hypertensive heart disease with heart failure: Secondary | ICD-10-CM | POA: Diagnosis not present

## 2021-02-05 DIAGNOSIS — D469 Myelodysplastic syndrome, unspecified: Secondary | ICD-10-CM | POA: Diagnosis not present

## 2021-02-06 DIAGNOSIS — E860 Dehydration: Secondary | ICD-10-CM | POA: Diagnosis not present

## 2021-02-06 DIAGNOSIS — E1122 Type 2 diabetes mellitus with diabetic chronic kidney disease: Secondary | ICD-10-CM | POA: Diagnosis not present

## 2021-02-06 DIAGNOSIS — I5032 Chronic diastolic (congestive) heart failure: Secondary | ICD-10-CM | POA: Diagnosis not present

## 2021-02-06 DIAGNOSIS — I110 Hypertensive heart disease with heart failure: Secondary | ICD-10-CM | POA: Diagnosis not present

## 2021-02-06 DIAGNOSIS — R197 Diarrhea, unspecified: Secondary | ICD-10-CM | POA: Diagnosis not present

## 2021-02-06 DIAGNOSIS — D469 Myelodysplastic syndrome, unspecified: Secondary | ICD-10-CM | POA: Diagnosis not present

## 2021-02-06 DIAGNOSIS — N1831 Chronic kidney disease, stage 3a: Secondary | ICD-10-CM | POA: Diagnosis not present

## 2021-02-06 DIAGNOSIS — J449 Chronic obstructive pulmonary disease, unspecified: Secondary | ICD-10-CM | POA: Diagnosis not present

## 2021-02-06 DIAGNOSIS — D631 Anemia in chronic kidney disease: Secondary | ICD-10-CM | POA: Diagnosis not present

## 2021-02-10 ENCOUNTER — Telehealth: Payer: Self-pay | Admitting: Oncology

## 2021-02-10 NOTE — Telephone Encounter (Signed)
02/10/21 Spoke with patient and sched appts

## 2021-02-11 DIAGNOSIS — Z6820 Body mass index (BMI) 20.0-20.9, adult: Secondary | ICD-10-CM | POA: Diagnosis not present

## 2021-02-11 DIAGNOSIS — L4050 Arthropathic psoriasis, unspecified: Secondary | ICD-10-CM | POA: Diagnosis not present

## 2021-02-11 DIAGNOSIS — E785 Hyperlipidemia, unspecified: Secondary | ICD-10-CM | POA: Diagnosis not present

## 2021-02-11 DIAGNOSIS — E118 Type 2 diabetes mellitus with unspecified complications: Secondary | ICD-10-CM | POA: Diagnosis not present

## 2021-02-11 DIAGNOSIS — C946 Myelodysplastic disease, not classified: Secondary | ICD-10-CM | POA: Diagnosis not present

## 2021-02-11 DIAGNOSIS — N183 Chronic kidney disease, stage 3 unspecified: Secondary | ICD-10-CM | POA: Diagnosis not present

## 2021-02-11 DIAGNOSIS — Z79899 Other long term (current) drug therapy: Secondary | ICD-10-CM | POA: Diagnosis not present

## 2021-02-11 DIAGNOSIS — I10 Essential (primary) hypertension: Secondary | ICD-10-CM | POA: Diagnosis not present

## 2021-02-11 DIAGNOSIS — M109 Gout, unspecified: Secondary | ICD-10-CM | POA: Diagnosis not present

## 2021-02-12 DIAGNOSIS — J449 Chronic obstructive pulmonary disease, unspecified: Secondary | ICD-10-CM | POA: Diagnosis not present

## 2021-02-12 DIAGNOSIS — N1831 Chronic kidney disease, stage 3a: Secondary | ICD-10-CM | POA: Diagnosis not present

## 2021-02-12 DIAGNOSIS — I110 Hypertensive heart disease with heart failure: Secondary | ICD-10-CM | POA: Diagnosis not present

## 2021-02-12 DIAGNOSIS — D631 Anemia in chronic kidney disease: Secondary | ICD-10-CM | POA: Diagnosis not present

## 2021-02-12 DIAGNOSIS — E860 Dehydration: Secondary | ICD-10-CM | POA: Diagnosis not present

## 2021-02-12 DIAGNOSIS — E1122 Type 2 diabetes mellitus with diabetic chronic kidney disease: Secondary | ICD-10-CM | POA: Diagnosis not present

## 2021-02-12 DIAGNOSIS — D469 Myelodysplastic syndrome, unspecified: Secondary | ICD-10-CM | POA: Diagnosis not present

## 2021-02-12 DIAGNOSIS — R197 Diarrhea, unspecified: Secondary | ICD-10-CM | POA: Diagnosis not present

## 2021-02-12 DIAGNOSIS — I5032 Chronic diastolic (congestive) heart failure: Secondary | ICD-10-CM | POA: Diagnosis not present

## 2021-02-18 ENCOUNTER — Inpatient Hospital Stay: Payer: Medicare PPO | Attending: Oncology

## 2021-02-18 ENCOUNTER — Other Ambulatory Visit: Payer: Self-pay | Admitting: Hematology and Oncology

## 2021-02-18 DIAGNOSIS — D461 Refractory anemia with ring sideroblasts: Secondary | ICD-10-CM | POA: Insufficient documentation

## 2021-02-18 DIAGNOSIS — D649 Anemia, unspecified: Secondary | ICD-10-CM | POA: Diagnosis not present

## 2021-02-18 LAB — COMPREHENSIVE METABOLIC PANEL
Albumin: 3.5 (ref 3.5–5.0)
Calcium: 8.6 — AB (ref 8.7–10.7)

## 2021-02-18 LAB — CBC AND DIFFERENTIAL
HCT: 29 — AB (ref 36–46)
Hemoglobin: 9 — AB (ref 12.0–16.0)
Neutrophils Absolute: 2.98
Platelets: 258 (ref 150–399)
WBC: 4.8

## 2021-02-18 LAB — BASIC METABOLIC PANEL
BUN: 25 — AB (ref 4–21)
CO2: 27 — AB (ref 13–22)
Chloride: 108 (ref 99–108)
Creatinine: 1.2 — AB (ref 0.5–1.1)
Glucose: 111
Potassium: 3.1 — AB (ref 3.4–5.3)
Sodium: 140 (ref 137–147)

## 2021-02-18 LAB — HEPATIC FUNCTION PANEL
ALT: 11 (ref 7–35)
AST: 24 (ref 13–35)
Alkaline Phosphatase: 70 (ref 25–125)
Bilirubin, Total: 1

## 2021-02-18 LAB — CBC: RBC: 2.78 — AB (ref 3.87–5.11)

## 2021-02-19 DIAGNOSIS — D631 Anemia in chronic kidney disease: Secondary | ICD-10-CM | POA: Diagnosis not present

## 2021-02-19 DIAGNOSIS — J449 Chronic obstructive pulmonary disease, unspecified: Secondary | ICD-10-CM | POA: Diagnosis not present

## 2021-02-19 DIAGNOSIS — N1831 Chronic kidney disease, stage 3a: Secondary | ICD-10-CM | POA: Diagnosis not present

## 2021-02-19 DIAGNOSIS — E860 Dehydration: Secondary | ICD-10-CM | POA: Diagnosis not present

## 2021-02-19 DIAGNOSIS — I5032 Chronic diastolic (congestive) heart failure: Secondary | ICD-10-CM | POA: Diagnosis not present

## 2021-02-19 DIAGNOSIS — E1122 Type 2 diabetes mellitus with diabetic chronic kidney disease: Secondary | ICD-10-CM | POA: Diagnosis not present

## 2021-02-19 DIAGNOSIS — R197 Diarrhea, unspecified: Secondary | ICD-10-CM | POA: Diagnosis not present

## 2021-02-19 DIAGNOSIS — I110 Hypertensive heart disease with heart failure: Secondary | ICD-10-CM | POA: Diagnosis not present

## 2021-02-19 DIAGNOSIS — D469 Myelodysplastic syndrome, unspecified: Secondary | ICD-10-CM | POA: Diagnosis not present

## 2021-02-20 ENCOUNTER — Inpatient Hospital Stay: Payer: Medicare PPO

## 2021-02-20 ENCOUNTER — Other Ambulatory Visit: Payer: Self-pay

## 2021-02-20 VITALS — BP 162/71 | HR 61 | Resp 18 | Ht 64.0 in | Wt 115.8 lb

## 2021-02-20 DIAGNOSIS — D461 Refractory anemia with ring sideroblasts: Secondary | ICD-10-CM | POA: Diagnosis not present

## 2021-02-20 MED ORDER — LUSPATERCEPT-AAMT 75 MG ~~LOC~~ SOLR
1.9000 mg/kg | Freq: Once | SUBCUTANEOUS | Status: AC
  Administered 2021-02-20: 100 mg via SUBCUTANEOUS
  Filled 2021-02-20: qty 0.5

## 2021-02-20 NOTE — Patient Instructions (Signed)
Luspatercept injection What is this medicine? LUSPATERCEPT (lus PAT er sept) helps your body make more red blood cells. This medicine is used to treat anemia caused by beta thalassemia or myelodysplastic syndromes. This medicine may be used for other purposes; ask your health care provider or pharmacist if you have questions. COMMON BRAND NAME(S): REBLOZYL What should I tell my health care provider before I take this medicine? They need to know if you have any of these conditions:  cigarette smoker  have had your spleen removed  high blood pressure  history of blood clots  an unusual or allergic reaction to luspatercept, other medicines, foods, dyes or preservatives  pregnant or trying to get pregnant  breast-feeding How should I use this medicine? This medicine is for injection under the skin. It is given by a healthcare professional in a hospital or clinic setting. Talk to your pediatrician about the use of the medicine in children. This medicine is not approved for use in children. Overdosage: If you think you have taken too much of this medicine contact a poison control center or emergency room at once. NOTE: This medicine is only for you. Do not share this medicine with others. What if I miss a dose? Keep appointments for follow-up doses. It is important not to miss your dose. Call your doctor or healthcare professional if you are unable to keep an appointment. What may interact with this medicine? Interactions are not expected. This list may not describe all possible interactions. Give your health care provider a list of all the medicines, herbs, non-prescription drugs, or dietary supplements you use. Also tell them if you smoke, drink alcohol, or use illegal drugs. Some items may interact with your medicine. What should I watch for while using this medicine? Your condition will be monitored carefully while you are receiving this medicine. Do not become pregnant while taking  this medicine or for 3 months after stopping it. Women should inform their healthcare professional if they wish to become pregnant or think they might be pregnant. There is a potential for serious side effects and harm to an unborn child. Talk to your healthcare professional for more information. Do not breast-feed an infant while taking this medicine. You may need blood work done while you are taking this medicine. What side effects may I notice from receiving this medicine? Side effects that you should report to your doctor or health care professional as soon as possible:  allergic reactions like skin rash, itching or hives; swelling of the face, lips, or tongue  signs and symptoms of a blood clot such as chest pain; shortness of breath; pain, swelling, or warmth in the leg  signs and symptoms of a stroke like changes in vision; confusion; trouble speaking or understanding; severe headaches; sudden numbness or weakness of the face, arm or leg; trouble walking; dizziness; loss of balance or coordination Side effects that usually do not require medical attention (report these to your doctor or health care professional if they continue or are bothersome):  cough  diarrhea  dizziness  headache  joint pain  stomach pain  tiredness This list may not describe all possible side effects. Call your doctor for medical advice about side effects. You may report side effects to FDA at 1-800-FDA-1088. Where should I keep my medicine? This medicine is given in a hospital or clinic and will not be stored at home. NOTE: This sheet is a summary. It may not cover all possible information. If you have questions about   this medicine, talk to your doctor, pharmacist, or health care provider.  2021 Elsevier/Gold Standard (2019-03-20 15:57:59)

## 2021-02-20 NOTE — Progress Notes (Signed)
1110: PT STABLE AT TIME OF DISCHARGE

## 2021-02-25 DIAGNOSIS — D631 Anemia in chronic kidney disease: Secondary | ICD-10-CM | POA: Diagnosis not present

## 2021-02-25 DIAGNOSIS — E860 Dehydration: Secondary | ICD-10-CM | POA: Diagnosis not present

## 2021-02-25 DIAGNOSIS — E1122 Type 2 diabetes mellitus with diabetic chronic kidney disease: Secondary | ICD-10-CM | POA: Diagnosis not present

## 2021-02-25 DIAGNOSIS — D469 Myelodysplastic syndrome, unspecified: Secondary | ICD-10-CM | POA: Diagnosis not present

## 2021-02-25 DIAGNOSIS — N1831 Chronic kidney disease, stage 3a: Secondary | ICD-10-CM | POA: Diagnosis not present

## 2021-02-25 DIAGNOSIS — I5032 Chronic diastolic (congestive) heart failure: Secondary | ICD-10-CM | POA: Diagnosis not present

## 2021-02-25 DIAGNOSIS — J449 Chronic obstructive pulmonary disease, unspecified: Secondary | ICD-10-CM | POA: Diagnosis not present

## 2021-02-25 DIAGNOSIS — R197 Diarrhea, unspecified: Secondary | ICD-10-CM | POA: Diagnosis not present

## 2021-02-25 DIAGNOSIS — I110 Hypertensive heart disease with heart failure: Secondary | ICD-10-CM | POA: Diagnosis not present

## 2021-03-04 DIAGNOSIS — E1122 Type 2 diabetes mellitus with diabetic chronic kidney disease: Secondary | ICD-10-CM | POA: Diagnosis not present

## 2021-03-04 DIAGNOSIS — R197 Diarrhea, unspecified: Secondary | ICD-10-CM | POA: Diagnosis not present

## 2021-03-04 DIAGNOSIS — J449 Chronic obstructive pulmonary disease, unspecified: Secondary | ICD-10-CM | POA: Diagnosis not present

## 2021-03-04 DIAGNOSIS — I110 Hypertensive heart disease with heart failure: Secondary | ICD-10-CM | POA: Diagnosis not present

## 2021-03-04 DIAGNOSIS — I5032 Chronic diastolic (congestive) heart failure: Secondary | ICD-10-CM | POA: Diagnosis not present

## 2021-03-04 DIAGNOSIS — E860 Dehydration: Secondary | ICD-10-CM | POA: Diagnosis not present

## 2021-03-04 DIAGNOSIS — D631 Anemia in chronic kidney disease: Secondary | ICD-10-CM | POA: Diagnosis not present

## 2021-03-04 DIAGNOSIS — N1831 Chronic kidney disease, stage 3a: Secondary | ICD-10-CM | POA: Diagnosis not present

## 2021-03-04 DIAGNOSIS — D469 Myelodysplastic syndrome, unspecified: Secondary | ICD-10-CM | POA: Diagnosis not present

## 2021-03-10 DIAGNOSIS — E860 Dehydration: Secondary | ICD-10-CM | POA: Diagnosis not present

## 2021-03-10 DIAGNOSIS — E1122 Type 2 diabetes mellitus with diabetic chronic kidney disease: Secondary | ICD-10-CM | POA: Diagnosis not present

## 2021-03-10 DIAGNOSIS — R197 Diarrhea, unspecified: Secondary | ICD-10-CM | POA: Diagnosis not present

## 2021-03-10 DIAGNOSIS — J449 Chronic obstructive pulmonary disease, unspecified: Secondary | ICD-10-CM | POA: Diagnosis not present

## 2021-03-10 DIAGNOSIS — I110 Hypertensive heart disease with heart failure: Secondary | ICD-10-CM | POA: Diagnosis not present

## 2021-03-10 DIAGNOSIS — I5032 Chronic diastolic (congestive) heart failure: Secondary | ICD-10-CM | POA: Diagnosis not present

## 2021-03-10 DIAGNOSIS — D631 Anemia in chronic kidney disease: Secondary | ICD-10-CM | POA: Diagnosis not present

## 2021-03-10 DIAGNOSIS — D469 Myelodysplastic syndrome, unspecified: Secondary | ICD-10-CM | POA: Diagnosis not present

## 2021-03-10 DIAGNOSIS — N1831 Chronic kidney disease, stage 3a: Secondary | ICD-10-CM | POA: Diagnosis not present

## 2021-03-11 ENCOUNTER — Other Ambulatory Visit: Payer: Self-pay

## 2021-03-11 ENCOUNTER — Other Ambulatory Visit: Payer: Self-pay | Admitting: Hematology and Oncology

## 2021-03-11 ENCOUNTER — Inpatient Hospital Stay: Payer: Medicare PPO

## 2021-03-11 DIAGNOSIS — D649 Anemia, unspecified: Secondary | ICD-10-CM | POA: Diagnosis not present

## 2021-03-11 DIAGNOSIS — D461 Refractory anemia with ring sideroblasts: Secondary | ICD-10-CM

## 2021-03-11 LAB — BASIC METABOLIC PANEL
BUN: 18 (ref 4–21)
CO2: 29 — AB (ref 13–22)
Chloride: 109 — AB (ref 99–108)
Creatinine: 1.1 (ref 0.5–1.1)
Glucose: 108
Potassium: 3.3 — AB (ref 3.4–5.3)
Sodium: 142 (ref 137–147)

## 2021-03-11 LAB — CBC AND DIFFERENTIAL
HCT: 30 — AB (ref 36–46)
Hemoglobin: 9.2 — AB (ref 12.0–16.0)
Neutrophils Absolute: 2.96
Platelets: 252 (ref 150–399)
WBC: 5.8

## 2021-03-11 LAB — COMPREHENSIVE METABOLIC PANEL
Albumin: 3.6 (ref 3.5–5.0)
Calcium: 8.5 — AB (ref 8.7–10.7)

## 2021-03-11 LAB — CBC: RBC: 2.81 — AB (ref 3.87–5.11)

## 2021-03-11 LAB — HEPATIC FUNCTION PANEL
ALT: 14 (ref 7–35)
AST: 34 (ref 13–35)
Alkaline Phosphatase: 73 (ref 25–125)
Bilirubin, Total: 1

## 2021-03-12 DIAGNOSIS — E1122 Type 2 diabetes mellitus with diabetic chronic kidney disease: Secondary | ICD-10-CM | POA: Diagnosis not present

## 2021-03-12 DIAGNOSIS — J449 Chronic obstructive pulmonary disease, unspecified: Secondary | ICD-10-CM | POA: Diagnosis not present

## 2021-03-12 DIAGNOSIS — E860 Dehydration: Secondary | ICD-10-CM | POA: Diagnosis not present

## 2021-03-12 DIAGNOSIS — D469 Myelodysplastic syndrome, unspecified: Secondary | ICD-10-CM | POA: Diagnosis not present

## 2021-03-12 DIAGNOSIS — I110 Hypertensive heart disease with heart failure: Secondary | ICD-10-CM | POA: Diagnosis not present

## 2021-03-12 DIAGNOSIS — D631 Anemia in chronic kidney disease: Secondary | ICD-10-CM | POA: Diagnosis not present

## 2021-03-12 DIAGNOSIS — R197 Diarrhea, unspecified: Secondary | ICD-10-CM | POA: Diagnosis not present

## 2021-03-12 DIAGNOSIS — N1831 Chronic kidney disease, stage 3a: Secondary | ICD-10-CM | POA: Diagnosis not present

## 2021-03-12 DIAGNOSIS — I5032 Chronic diastolic (congestive) heart failure: Secondary | ICD-10-CM | POA: Diagnosis not present

## 2021-03-13 ENCOUNTER — Other Ambulatory Visit: Payer: Self-pay

## 2021-03-13 ENCOUNTER — Inpatient Hospital Stay: Payer: Medicare PPO | Attending: Oncology

## 2021-03-13 VITALS — BP 169/77 | HR 60 | Temp 97.6°F | Resp 18 | Ht 64.0 in | Wt 120.2 lb

## 2021-03-13 DIAGNOSIS — Z8679 Personal history of other diseases of the circulatory system: Secondary | ICD-10-CM | POA: Diagnosis not present

## 2021-03-13 DIAGNOSIS — M1990 Unspecified osteoarthritis, unspecified site: Secondary | ICD-10-CM | POA: Diagnosis not present

## 2021-03-13 DIAGNOSIS — R0602 Shortness of breath: Secondary | ICD-10-CM | POA: Diagnosis not present

## 2021-03-13 DIAGNOSIS — I110 Hypertensive heart disease with heart failure: Secondary | ICD-10-CM | POA: Diagnosis not present

## 2021-03-13 DIAGNOSIS — R609 Edema, unspecified: Secondary | ICD-10-CM | POA: Diagnosis not present

## 2021-03-13 DIAGNOSIS — I509 Heart failure, unspecified: Secondary | ICD-10-CM | POA: Diagnosis not present

## 2021-03-13 DIAGNOSIS — Z862 Personal history of diseases of the blood and blood-forming organs and certain disorders involving the immune mechanism: Secondary | ICD-10-CM | POA: Diagnosis not present

## 2021-03-13 DIAGNOSIS — I160 Hypertensive urgency: Secondary | ICD-10-CM | POA: Diagnosis not present

## 2021-03-13 DIAGNOSIS — D461 Refractory anemia with ring sideroblasts: Secondary | ICD-10-CM | POA: Insufficient documentation

## 2021-03-13 DIAGNOSIS — Z7952 Long term (current) use of systemic steroids: Secondary | ICD-10-CM | POA: Diagnosis not present

## 2021-03-13 DIAGNOSIS — I10 Essential (primary) hypertension: Secondary | ICD-10-CM | POA: Diagnosis not present

## 2021-03-13 DIAGNOSIS — R0902 Hypoxemia: Secondary | ICD-10-CM | POA: Diagnosis not present

## 2021-03-13 DIAGNOSIS — J441 Chronic obstructive pulmonary disease with (acute) exacerbation: Secondary | ICD-10-CM | POA: Diagnosis not present

## 2021-03-13 DIAGNOSIS — I5033 Acute on chronic diastolic (congestive) heart failure: Secondary | ICD-10-CM | POA: Diagnosis not present

## 2021-03-13 DIAGNOSIS — Z87891 Personal history of nicotine dependence: Secondary | ICD-10-CM | POA: Diagnosis not present

## 2021-03-13 MED ORDER — LUSPATERCEPT-AAMT 75 MG ~~LOC~~ SOLR
1.9000 mg/kg | Freq: Once | SUBCUTANEOUS | Status: AC
  Administered 2021-03-13: 100 mg via SUBCUTANEOUS
  Filled 2021-03-13: qty 1.5

## 2021-03-13 NOTE — Patient Instructions (Signed)
Luspatercept injection What is this medicine? LUSPATERCEPT (lus PAT er sept) helps your body make more red blood cells. This medicine is used to treat anemia caused by beta thalassemia or myelodysplastic syndromes. This medicine may be used for other purposes; ask your health care provider or pharmacist if you have questions. COMMON BRAND NAME(S): REBLOZYL What should I tell my health care provider before I take this medicine? They need to know if you have any of these conditions:  cigarette smoker  have had your spleen removed  high blood pressure  history of blood clots  an unusual or allergic reaction to luspatercept, other medicines, foods, dyes or preservatives  pregnant or trying to get pregnant  breast-feeding How should I use this medicine? This medicine is for injection under the skin. It is given by a healthcare professional in a hospital or clinic setting. Talk to your pediatrician about the use of the medicine in children. This medicine is not approved for use in children. Overdosage: If you think you have taken too much of this medicine contact a poison control center or emergency room at once. NOTE: This medicine is only for you. Do not share this medicine with others. What if I miss a dose? Keep appointments for follow-up doses. It is important not to miss your dose. Call your doctor or healthcare professional if you are unable to keep an appointment. What may interact with this medicine? Interactions are not expected. This list may not describe all possible interactions. Give your health care provider a list of all the medicines, herbs, non-prescription drugs, or dietary supplements you use. Also tell them if you smoke, drink alcohol, or use illegal drugs. Some items may interact with your medicine. What should I watch for while using this medicine? Your condition will be monitored carefully while you are receiving this medicine. Do not become pregnant while taking  this medicine or for 3 months after stopping it. Women should inform their healthcare professional if they wish to become pregnant or think they might be pregnant. There is a potential for serious side effects and harm to an unborn child. Talk to your healthcare professional for more information. Do not breast-feed an infant while taking this medicine. You may need blood work done while you are taking this medicine. What side effects may I notice from receiving this medicine? Side effects that you should report to your doctor or health care professional as soon as possible:  allergic reactions like skin rash, itching or hives; swelling of the face, lips, or tongue  signs and symptoms of a blood clot such as chest pain; shortness of breath; pain, swelling, or warmth in the leg  signs and symptoms of a stroke like changes in vision; confusion; trouble speaking or understanding; severe headaches; sudden numbness or weakness of the face, arm or leg; trouble walking; dizziness; loss of balance or coordination Side effects that usually do not require medical attention (report these to your doctor or health care professional if they continue or are bothersome):  cough  diarrhea  dizziness  headache  joint pain  stomach pain  tiredness This list may not describe all possible side effects. Call your doctor for medical advice about side effects. You may report side effects to FDA at 1-800-FDA-1088. Where should I keep my medicine? This medicine is given in a hospital or clinic and will not be stored at home. NOTE: This sheet is a summary. It may not cover all possible information. If you have questions about   this medicine, talk to your doctor, pharmacist, or health care provider.  2021 Elsevier/Gold Standard (2019-03-20 15:57:59)

## 2021-03-14 DIAGNOSIS — Z87891 Personal history of nicotine dependence: Secondary | ICD-10-CM | POA: Diagnosis not present

## 2021-03-14 DIAGNOSIS — I110 Hypertensive heart disease with heart failure: Secondary | ICD-10-CM | POA: Diagnosis not present

## 2021-03-14 DIAGNOSIS — Z7952 Long term (current) use of systemic steroids: Secondary | ICD-10-CM | POA: Diagnosis not present

## 2021-03-14 DIAGNOSIS — I340 Nonrheumatic mitral (valve) insufficiency: Secondary | ICD-10-CM | POA: Diagnosis not present

## 2021-03-14 DIAGNOSIS — I5033 Acute on chronic diastolic (congestive) heart failure: Secondary | ICD-10-CM | POA: Diagnosis not present

## 2021-03-14 DIAGNOSIS — J441 Chronic obstructive pulmonary disease with (acute) exacerbation: Secondary | ICD-10-CM | POA: Diagnosis not present

## 2021-03-14 DIAGNOSIS — Z8679 Personal history of other diseases of the circulatory system: Secondary | ICD-10-CM | POA: Diagnosis not present

## 2021-03-14 DIAGNOSIS — I361 Nonrheumatic tricuspid (valve) insufficiency: Secondary | ICD-10-CM | POA: Diagnosis not present

## 2021-03-14 DIAGNOSIS — M1990 Unspecified osteoarthritis, unspecified site: Secondary | ICD-10-CM | POA: Diagnosis not present

## 2021-03-14 DIAGNOSIS — Z862 Personal history of diseases of the blood and blood-forming organs and certain disorders involving the immune mechanism: Secondary | ICD-10-CM | POA: Diagnosis not present

## 2021-03-14 DIAGNOSIS — I160 Hypertensive urgency: Secondary | ICD-10-CM | POA: Diagnosis not present

## 2021-03-15 DIAGNOSIS — E119 Type 2 diabetes mellitus without complications: Secondary | ICD-10-CM | POA: Diagnosis not present

## 2021-03-15 DIAGNOSIS — Z87891 Personal history of nicotine dependence: Secondary | ICD-10-CM | POA: Diagnosis not present

## 2021-03-15 DIAGNOSIS — Z79899 Other long term (current) drug therapy: Secondary | ICD-10-CM | POA: Diagnosis not present

## 2021-03-15 DIAGNOSIS — J449 Chronic obstructive pulmonary disease, unspecified: Secondary | ICD-10-CM | POA: Diagnosis not present

## 2021-03-15 DIAGNOSIS — R0602 Shortness of breath: Secondary | ICD-10-CM | POA: Diagnosis not present

## 2021-03-15 DIAGNOSIS — Z8679 Personal history of other diseases of the circulatory system: Secondary | ICD-10-CM | POA: Diagnosis not present

## 2021-03-15 DIAGNOSIS — Z862 Personal history of diseases of the blood and blood-forming organs and certain disorders involving the immune mechanism: Secondary | ICD-10-CM | POA: Diagnosis not present

## 2021-03-15 DIAGNOSIS — I5033 Acute on chronic diastolic (congestive) heart failure: Secondary | ICD-10-CM | POA: Diagnosis not present

## 2021-03-15 DIAGNOSIS — I10 Essential (primary) hypertension: Secondary | ICD-10-CM | POA: Diagnosis not present

## 2021-03-15 DIAGNOSIS — Z7952 Long term (current) use of systemic steroids: Secondary | ICD-10-CM | POA: Diagnosis not present

## 2021-03-15 DIAGNOSIS — M1990 Unspecified osteoarthritis, unspecified site: Secondary | ICD-10-CM | POA: Diagnosis not present

## 2021-03-15 DIAGNOSIS — J441 Chronic obstructive pulmonary disease with (acute) exacerbation: Secondary | ICD-10-CM | POA: Diagnosis not present

## 2021-03-15 DIAGNOSIS — R0609 Other forms of dyspnea: Secondary | ICD-10-CM | POA: Diagnosis not present

## 2021-03-15 DIAGNOSIS — R0600 Dyspnea, unspecified: Secondary | ICD-10-CM | POA: Diagnosis not present

## 2021-03-15 DIAGNOSIS — I160 Hypertensive urgency: Secondary | ICD-10-CM | POA: Diagnosis not present

## 2021-03-15 DIAGNOSIS — I110 Hypertensive heart disease with heart failure: Secondary | ICD-10-CM | POA: Diagnosis not present

## 2021-03-16 DIAGNOSIS — M109 Gout, unspecified: Secondary | ICD-10-CM | POA: Diagnosis not present

## 2021-03-16 DIAGNOSIS — I10 Essential (primary) hypertension: Secondary | ICD-10-CM | POA: Diagnosis not present

## 2021-03-16 DIAGNOSIS — E785 Hyperlipidemia, unspecified: Secondary | ICD-10-CM | POA: Diagnosis not present

## 2021-03-16 DIAGNOSIS — I50810 Right heart failure, unspecified: Secondary | ICD-10-CM | POA: Diagnosis not present

## 2021-03-16 DIAGNOSIS — E118 Type 2 diabetes mellitus with unspecified complications: Secondary | ICD-10-CM | POA: Diagnosis not present

## 2021-03-16 DIAGNOSIS — C946 Myelodysplastic disease, not classified: Secondary | ICD-10-CM | POA: Diagnosis not present

## 2021-03-16 DIAGNOSIS — L409 Psoriasis, unspecified: Secondary | ICD-10-CM | POA: Diagnosis not present

## 2021-03-16 DIAGNOSIS — L4050 Arthropathic psoriasis, unspecified: Secondary | ICD-10-CM | POA: Diagnosis not present

## 2021-03-16 DIAGNOSIS — J449 Chronic obstructive pulmonary disease, unspecified: Secondary | ICD-10-CM | POA: Diagnosis not present

## 2021-03-17 DIAGNOSIS — E119 Type 2 diabetes mellitus without complications: Secondary | ICD-10-CM | POA: Diagnosis not present

## 2021-03-17 DIAGNOSIS — Z8616 Personal history of COVID-19: Secondary | ICD-10-CM | POA: Diagnosis not present

## 2021-03-17 DIAGNOSIS — Z87891 Personal history of nicotine dependence: Secondary | ICD-10-CM | POA: Diagnosis not present

## 2021-03-17 DIAGNOSIS — Z79899 Other long term (current) drug therapy: Secondary | ICD-10-CM | POA: Diagnosis not present

## 2021-03-17 DIAGNOSIS — J441 Chronic obstructive pulmonary disease with (acute) exacerbation: Secondary | ICD-10-CM | POA: Diagnosis not present

## 2021-03-17 DIAGNOSIS — I509 Heart failure, unspecified: Secondary | ICD-10-CM | POA: Diagnosis not present

## 2021-03-17 DIAGNOSIS — I160 Hypertensive urgency: Secondary | ICD-10-CM | POA: Diagnosis not present

## 2021-03-17 DIAGNOSIS — D469 Myelodysplastic syndrome, unspecified: Secondary | ICD-10-CM | POA: Diagnosis not present

## 2021-03-17 DIAGNOSIS — I110 Hypertensive heart disease with heart failure: Secondary | ICD-10-CM | POA: Diagnosis not present

## 2021-03-18 DIAGNOSIS — I160 Hypertensive urgency: Secondary | ICD-10-CM | POA: Diagnosis not present

## 2021-03-18 DIAGNOSIS — I110 Hypertensive heart disease with heart failure: Secondary | ICD-10-CM | POA: Diagnosis not present

## 2021-03-18 DIAGNOSIS — I509 Heart failure, unspecified: Secondary | ICD-10-CM | POA: Diagnosis not present

## 2021-03-18 DIAGNOSIS — J441 Chronic obstructive pulmonary disease with (acute) exacerbation: Secondary | ICD-10-CM | POA: Diagnosis not present

## 2021-03-18 DIAGNOSIS — Z8616 Personal history of COVID-19: Secondary | ICD-10-CM | POA: Diagnosis not present

## 2021-03-18 DIAGNOSIS — D469 Myelodysplastic syndrome, unspecified: Secondary | ICD-10-CM | POA: Diagnosis not present

## 2021-03-18 DIAGNOSIS — E119 Type 2 diabetes mellitus without complications: Secondary | ICD-10-CM | POA: Diagnosis not present

## 2021-03-18 DIAGNOSIS — Z87891 Personal history of nicotine dependence: Secondary | ICD-10-CM | POA: Diagnosis not present

## 2021-03-18 DIAGNOSIS — Z79899 Other long term (current) drug therapy: Secondary | ICD-10-CM | POA: Diagnosis not present

## 2021-03-19 DIAGNOSIS — E538 Deficiency of other specified B group vitamins: Secondary | ICD-10-CM | POA: Diagnosis not present

## 2021-03-19 DIAGNOSIS — E785 Hyperlipidemia, unspecified: Secondary | ICD-10-CM | POA: Diagnosis not present

## 2021-03-19 DIAGNOSIS — I10 Essential (primary) hypertension: Secondary | ICD-10-CM | POA: Diagnosis not present

## 2021-03-19 DIAGNOSIS — L4050 Arthropathic psoriasis, unspecified: Secondary | ICD-10-CM | POA: Diagnosis not present

## 2021-03-19 DIAGNOSIS — I50810 Right heart failure, unspecified: Secondary | ICD-10-CM | POA: Diagnosis not present

## 2021-03-19 DIAGNOSIS — E559 Vitamin D deficiency, unspecified: Secondary | ICD-10-CM | POA: Diagnosis not present

## 2021-03-19 DIAGNOSIS — N183 Chronic kidney disease, stage 3 unspecified: Secondary | ICD-10-CM | POA: Diagnosis not present

## 2021-03-19 DIAGNOSIS — Z79899 Other long term (current) drug therapy: Secondary | ICD-10-CM | POA: Diagnosis not present

## 2021-03-19 DIAGNOSIS — C946 Myelodysplastic disease, not classified: Secondary | ICD-10-CM | POA: Diagnosis not present

## 2021-03-23 ENCOUNTER — Encounter: Payer: Self-pay | Admitting: *Deleted

## 2021-03-23 ENCOUNTER — Encounter: Payer: Self-pay | Admitting: Cardiology

## 2021-03-24 DIAGNOSIS — Z8616 Personal history of COVID-19: Secondary | ICD-10-CM | POA: Diagnosis not present

## 2021-03-24 DIAGNOSIS — I160 Hypertensive urgency: Secondary | ICD-10-CM | POA: Diagnosis not present

## 2021-03-24 DIAGNOSIS — Z79899 Other long term (current) drug therapy: Secondary | ICD-10-CM | POA: Diagnosis not present

## 2021-03-24 DIAGNOSIS — I509 Heart failure, unspecified: Secondary | ICD-10-CM | POA: Diagnosis not present

## 2021-03-24 DIAGNOSIS — Z87891 Personal history of nicotine dependence: Secondary | ICD-10-CM | POA: Diagnosis not present

## 2021-03-24 DIAGNOSIS — E119 Type 2 diabetes mellitus without complications: Secondary | ICD-10-CM | POA: Diagnosis not present

## 2021-03-24 DIAGNOSIS — D469 Myelodysplastic syndrome, unspecified: Secondary | ICD-10-CM | POA: Diagnosis not present

## 2021-03-24 DIAGNOSIS — J441 Chronic obstructive pulmonary disease with (acute) exacerbation: Secondary | ICD-10-CM | POA: Diagnosis not present

## 2021-03-24 DIAGNOSIS — I110 Hypertensive heart disease with heart failure: Secondary | ICD-10-CM | POA: Diagnosis not present

## 2021-03-26 DIAGNOSIS — E119 Type 2 diabetes mellitus without complications: Secondary | ICD-10-CM | POA: Diagnosis not present

## 2021-03-26 DIAGNOSIS — I160 Hypertensive urgency: Secondary | ICD-10-CM | POA: Diagnosis not present

## 2021-03-26 DIAGNOSIS — J441 Chronic obstructive pulmonary disease with (acute) exacerbation: Secondary | ICD-10-CM | POA: Diagnosis not present

## 2021-03-26 DIAGNOSIS — Z87891 Personal history of nicotine dependence: Secondary | ICD-10-CM | POA: Diagnosis not present

## 2021-03-26 DIAGNOSIS — I110 Hypertensive heart disease with heart failure: Secondary | ICD-10-CM | POA: Diagnosis not present

## 2021-03-26 DIAGNOSIS — I509 Heart failure, unspecified: Secondary | ICD-10-CM | POA: Diagnosis not present

## 2021-03-26 DIAGNOSIS — Z8616 Personal history of COVID-19: Secondary | ICD-10-CM | POA: Diagnosis not present

## 2021-03-26 DIAGNOSIS — Z79899 Other long term (current) drug therapy: Secondary | ICD-10-CM | POA: Diagnosis not present

## 2021-03-26 DIAGNOSIS — D469 Myelodysplastic syndrome, unspecified: Secondary | ICD-10-CM | POA: Diagnosis not present

## 2021-03-31 DIAGNOSIS — Z8616 Personal history of COVID-19: Secondary | ICD-10-CM | POA: Diagnosis not present

## 2021-03-31 DIAGNOSIS — E119 Type 2 diabetes mellitus without complications: Secondary | ICD-10-CM | POA: Diagnosis not present

## 2021-03-31 DIAGNOSIS — I110 Hypertensive heart disease with heart failure: Secondary | ICD-10-CM | POA: Diagnosis not present

## 2021-03-31 DIAGNOSIS — Z87891 Personal history of nicotine dependence: Secondary | ICD-10-CM | POA: Diagnosis not present

## 2021-03-31 DIAGNOSIS — D469 Myelodysplastic syndrome, unspecified: Secondary | ICD-10-CM | POA: Diagnosis not present

## 2021-03-31 DIAGNOSIS — Z79899 Other long term (current) drug therapy: Secondary | ICD-10-CM | POA: Diagnosis not present

## 2021-03-31 DIAGNOSIS — I509 Heart failure, unspecified: Secondary | ICD-10-CM | POA: Diagnosis not present

## 2021-03-31 DIAGNOSIS — I160 Hypertensive urgency: Secondary | ICD-10-CM | POA: Diagnosis not present

## 2021-03-31 DIAGNOSIS — J441 Chronic obstructive pulmonary disease with (acute) exacerbation: Secondary | ICD-10-CM | POA: Diagnosis not present

## 2021-03-31 NOTE — Progress Notes (Signed)
[facility]  [address] [place],  [place]  [address] [phone]  Clinic Day:  04/01/2021  Referring physician: Penelope Coop, FNP   HISTORY OF PRESENT ILLNESS:  The patient is an 87 y.o. female with myelodysplasia-refractory anemia with ring sideroblasts.  The patient has been taking luspatercept over these past months, with the goal being to keep her hemoglobin above 10.  She comes in today to reassess her myelodysplasia.  Since her last visit, the patient has been doing okay.  Her weakness has improved she has gotten back to taking luspatercept every 3 weeks.     PHYSICAL EXAM:  Blood pressure (!) 110/56, pulse 62, temperature 97.7 F (36.5 C), resp. rate 14, height '5\' 4"'  (1.626 m), weight 110 lb (49.9 kg), SpO2 92 %. Wt Readings from Last 3 Encounters:  04/01/21 110 lb (49.9 kg)  03/13/21 120 lb 4 oz (54.5 kg)  02/20/21 115 lb 12 oz (52.5 kg)   Body mass index is 18.88 kg/m. Performance status (ECOG): 3 Physical Exam Constitutional:      Appearance: Normal appearance. She is ill-appearing (Chronically ill-appearing woman who is in a wheelchair).  HENT:     Mouth/Throat:     Mouth: Mucous membranes are moist.     Pharynx: Oropharynx is clear. No oropharyngeal exudate or posterior oropharyngeal erythema.  Cardiovascular:     Rate and Rhythm: Normal rate and regular rhythm.     Heart sounds: No murmur heard. No friction rub. No gallop.   Pulmonary:     Effort: Pulmonary effort is normal. No respiratory distress.     Breath sounds: Normal breath sounds. No wheezing, rhonchi or rales.  Chest:  Breasts:     Right: No axillary adenopathy or supraclavicular adenopathy.     Left: No axillary adenopathy or supraclavicular adenopathy.    Abdominal:     General: Bowel sounds are normal. There is no distension.     Palpations: Abdomen is soft. There is no mass.     Tenderness: There is no abdominal tenderness.  Musculoskeletal:         General: No swelling.     Right lower leg: No edema.     Left lower leg: No edema.  Lymphadenopathy:     Cervical: No cervical adenopathy.     Upper Body:     Right upper body: No supraclavicular or axillary adenopathy.     Left upper body: No supraclavicular or axillary adenopathy.     Lower Body: No right inguinal adenopathy. No left inguinal adenopathy.  Skin:    General: Skin is warm.     Coloration: Skin is not jaundiced.     Findings: No bruising, lesion or rash.  Neurological:     General: No focal deficit present.     Mental Status: She is alert and oriented to person, place, and time. Mental status is at baseline.     Cranial Nerves: Cranial nerves are intact.  Psychiatric:        Mood and Affect: Mood normal.        Behavior: Behavior normal.        Thought Content: Thought content normal.     LABS:   CBC Latest Ref Rng & Units 04/01/2021 03/11/2021 02/18/2021  WBC - 5.8 5.8 4.8  Hemoglobin 12.0 - 16.0 11.4(A) 9.2(A) 9.0(A)  Hematocrit 36 - 46 37 30(A) 29(A)  Platelets 150 - 399 257 252 258   CMP Latest Ref Rng & Units 04/01/2021  03/11/2021 02/18/2021  Glucose 70 - 99 mg/dL - - -  BUN 4 - 21 38(A) 18 25(A)  Creatinine 0.5 - 1.1 1.4(A) 1.1 1.2(A)  Sodium 137 - 147 141 142 140  Potassium 3.4 - 5.3 3.7 3.3(A) 3.1(A)  Chloride 99 - 108 106 109(A) 108  CO2 13 - 22 30(A) 29(A) 27(A)  Calcium 8.7 - 10.7 9.6 8.5(A) 8.6(A)  Total Protein 6.5 - 8.1 g/dL - - -  Total Bilirubin 0.3 - 1.2 mg/dL - - -  Alkaline Phos 25 - 125 94 73 70  AST 13 - 35 36(A) 34 24  ALT 7 - 35 '18 14 11    ' ASSESSMENT & PLAN:  Assessment/Plan:  An 87 y.o. female with myelodysplasia-RARS.  When evaluating her hemoglobin today, it has significantly gotten better since luspatercept has been reintroduced.  She will continue receiving luspatercept injections every 3 weeks. I will see her back in 12 weeks for repeat clinical assessment.  The patient understands all the plans discussed today and is in agreement  with them.       Amily Depp Macarthur Critchley, MD

## 2021-04-01 ENCOUNTER — Encounter: Payer: Self-pay | Admitting: Oncology

## 2021-04-01 ENCOUNTER — Inpatient Hospital Stay: Payer: Medicare PPO

## 2021-04-01 ENCOUNTER — Other Ambulatory Visit: Payer: Self-pay

## 2021-04-01 ENCOUNTER — Other Ambulatory Visit: Payer: Self-pay | Admitting: Hematology and Oncology

## 2021-04-01 ENCOUNTER — Other Ambulatory Visit: Payer: Self-pay | Admitting: Oncology

## 2021-04-01 ENCOUNTER — Inpatient Hospital Stay (INDEPENDENT_AMBULATORY_CARE_PROVIDER_SITE_OTHER): Payer: Medicare PPO | Admitting: Oncology

## 2021-04-01 VITALS — BP 110/56 | HR 62 | Temp 97.7°F | Resp 14 | Ht 64.0 in | Wt 110.0 lb

## 2021-04-01 DIAGNOSIS — D461 Refractory anemia with ring sideroblasts: Secondary | ICD-10-CM

## 2021-04-01 DIAGNOSIS — D649 Anemia, unspecified: Secondary | ICD-10-CM | POA: Diagnosis not present

## 2021-04-01 DIAGNOSIS — D469 Myelodysplastic syndrome, unspecified: Secondary | ICD-10-CM

## 2021-04-01 LAB — BASIC METABOLIC PANEL
BUN: 38 — AB (ref 4–21)
CO2: 30 — AB (ref 13–22)
Chloride: 106 (ref 99–108)
Creatinine: 1.4 — AB (ref 0.5–1.1)
Glucose: 110
Potassium: 3.7 (ref 3.4–5.3)
Sodium: 141 (ref 137–147)

## 2021-04-01 LAB — CBC AND DIFFERENTIAL
HCT: 37 (ref 36–46)
Hemoglobin: 11.4 — AB (ref 12.0–16.0)
Neutrophils Absolute: 3.19
Platelets: 257 (ref 150–399)
WBC: 5.8

## 2021-04-01 LAB — CBC
MCV: 105 — AB (ref 81–99)
RBC: 3.52 — AB (ref 3.87–5.11)

## 2021-04-01 LAB — COMPREHENSIVE METABOLIC PANEL
Albumin: 4.2 (ref 3.5–5.0)
Calcium: 9.6 (ref 8.7–10.7)

## 2021-04-01 LAB — HEPATIC FUNCTION PANEL
ALT: 18 (ref 7–35)
AST: 36 — AB (ref 13–35)
Alkaline Phosphatase: 94 (ref 25–125)
Bilirubin, Total: 0.8

## 2021-04-03 ENCOUNTER — Other Ambulatory Visit: Payer: Self-pay

## 2021-04-03 ENCOUNTER — Inpatient Hospital Stay: Payer: Medicare PPO

## 2021-04-03 ENCOUNTER — Other Ambulatory Visit: Payer: Medicare PPO

## 2021-04-03 ENCOUNTER — Ambulatory Visit: Payer: Medicare PPO | Admitting: Oncology

## 2021-04-03 VITALS — BP 115/48 | HR 57 | Temp 97.8°F | Resp 18 | Ht 64.0 in | Wt 112.5 lb

## 2021-04-03 DIAGNOSIS — D461 Refractory anemia with ring sideroblasts: Secondary | ICD-10-CM | POA: Diagnosis not present

## 2021-04-03 MED ORDER — LUSPATERCEPT-AAMT 75 MG ~~LOC~~ SOLR
1.4000 mg/kg | Freq: Once | SUBCUTANEOUS | Status: AC
  Administered 2021-04-03: 75 mg via SUBCUTANEOUS
  Filled 2021-04-03: qty 1.5

## 2021-04-03 NOTE — Patient Instructions (Signed)
Luspatercept injection What is this medicine? LUSPATERCEPT (lus PAT er sept) helps your body make more red blood cells. This medicine is used to treat anemia caused by beta thalassemia or myelodysplastic syndromes. This medicine may be used for other purposes; ask your health care provider or pharmacist if you have questions. COMMON BRAND NAME(S): REBLOZYL What should I tell my health care provider before I take this medicine? They need to know if you have any of these conditions:  cigarette smoker  have had your spleen removed  high blood pressure  history of blood clots  an unusual or allergic reaction to luspatercept, other medicines, foods, dyes or preservatives  pregnant or trying to get pregnant  breast-feeding How should I use this medicine? This medicine is for injection under the skin. It is given by a healthcare professional in a hospital or clinic setting. Talk to your pediatrician about the use of the medicine in children. This medicine is not approved for use in children. Overdosage: If you think you have taken too much of this medicine contact a poison control center or emergency room at once. NOTE: This medicine is only for you. Do not share this medicine with others. What if I miss a dose? Keep appointments for follow-up doses. It is important not to miss your dose. Call your doctor or healthcare professional if you are unable to keep an appointment. What may interact with this medicine? Interactions are not expected. This list may not describe all possible interactions. Give your health care provider a list of all the medicines, herbs, non-prescription drugs, or dietary supplements you use. Also tell them if you smoke, drink alcohol, or use illegal drugs. Some items may interact with your medicine. What should I watch for while using this medicine? Your condition will be monitored carefully while you are receiving this medicine. Do not become pregnant while taking  this medicine or for 3 months after stopping it. Women should inform their healthcare professional if they wish to become pregnant or think they might be pregnant. There is a potential for serious side effects and harm to an unborn child. Talk to your healthcare professional for more information. Do not breast-feed an infant while taking this medicine. You may need blood work done while you are taking this medicine. What side effects may I notice from receiving this medicine? Side effects that you should report to your doctor or health care professional as soon as possible:  allergic reactions like skin rash, itching or hives; swelling of the face, lips, or tongue  signs and symptoms of a blood clot such as chest pain; shortness of breath; pain, swelling, or warmth in the leg  signs and symptoms of a stroke like changes in vision; confusion; trouble speaking or understanding; severe headaches; sudden numbness or weakness of the face, arm or leg; trouble walking; dizziness; loss of balance or coordination Side effects that usually do not require medical attention (report these to your doctor or health care professional if they continue or are bothersome):  cough  diarrhea  dizziness  headache  joint pain  stomach pain  tiredness This list may not describe all possible side effects. Call your doctor for medical advice about side effects. You may report side effects to FDA at 1-800-FDA-1088. Where should I keep my medicine? This medicine is given in a hospital or clinic and will not be stored at home. NOTE: This sheet is a summary. It may not cover all possible information. If you have questions about   this medicine, talk to your doctor, pharmacist, or health care provider.  2021 Elsevier/Gold Standard (2019-03-20 15:57:59)

## 2021-04-07 ENCOUNTER — Ambulatory Visit: Payer: Medicare PPO | Admitting: Cardiology

## 2021-04-07 DIAGNOSIS — I110 Hypertensive heart disease with heart failure: Secondary | ICD-10-CM | POA: Diagnosis not present

## 2021-04-07 DIAGNOSIS — I160 Hypertensive urgency: Secondary | ICD-10-CM | POA: Diagnosis not present

## 2021-04-07 DIAGNOSIS — Z87891 Personal history of nicotine dependence: Secondary | ICD-10-CM | POA: Diagnosis not present

## 2021-04-07 DIAGNOSIS — J441 Chronic obstructive pulmonary disease with (acute) exacerbation: Secondary | ICD-10-CM | POA: Diagnosis not present

## 2021-04-07 DIAGNOSIS — E119 Type 2 diabetes mellitus without complications: Secondary | ICD-10-CM | POA: Diagnosis not present

## 2021-04-07 DIAGNOSIS — I509 Heart failure, unspecified: Secondary | ICD-10-CM | POA: Diagnosis not present

## 2021-04-07 DIAGNOSIS — D469 Myelodysplastic syndrome, unspecified: Secondary | ICD-10-CM | POA: Diagnosis not present

## 2021-04-07 DIAGNOSIS — Z8616 Personal history of COVID-19: Secondary | ICD-10-CM | POA: Diagnosis not present

## 2021-04-07 DIAGNOSIS — Z79899 Other long term (current) drug therapy: Secondary | ICD-10-CM | POA: Diagnosis not present

## 2021-04-08 ENCOUNTER — Telehealth: Payer: Self-pay | Admitting: Cardiology

## 2021-04-08 DIAGNOSIS — E119 Type 2 diabetes mellitus without complications: Secondary | ICD-10-CM | POA: Diagnosis not present

## 2021-04-08 DIAGNOSIS — I509 Heart failure, unspecified: Secondary | ICD-10-CM | POA: Diagnosis not present

## 2021-04-08 DIAGNOSIS — I110 Hypertensive heart disease with heart failure: Secondary | ICD-10-CM | POA: Diagnosis not present

## 2021-04-08 DIAGNOSIS — J441 Chronic obstructive pulmonary disease with (acute) exacerbation: Secondary | ICD-10-CM | POA: Diagnosis not present

## 2021-04-08 DIAGNOSIS — Z8616 Personal history of COVID-19: Secondary | ICD-10-CM | POA: Diagnosis not present

## 2021-04-08 DIAGNOSIS — Z87891 Personal history of nicotine dependence: Secondary | ICD-10-CM | POA: Diagnosis not present

## 2021-04-08 DIAGNOSIS — Z79899 Other long term (current) drug therapy: Secondary | ICD-10-CM | POA: Diagnosis not present

## 2021-04-08 DIAGNOSIS — D469 Myelodysplastic syndrome, unspecified: Secondary | ICD-10-CM | POA: Diagnosis not present

## 2021-04-08 DIAGNOSIS — I160 Hypertensive urgency: Secondary | ICD-10-CM | POA: Diagnosis not present

## 2021-04-08 NOTE — Telephone Encounter (Signed)
Called and spoke to [place]  with home health. And spoke to patient. Patient's oxygen today was 87-88% and goes lower on exertion. No weight gain, no swelling, no shortness of breath at this time. Advised Lana and patient that patient will need to be set up with oxygen through PCP as they are the ones who arrange that. Furthermore the patient does not want to go to the hospital. The nurse also states the patient's lungs are clear she just feels she needs some oxygen to help her sometimes. Will consult with DOD.

## 2021-04-08 NOTE — Telephone Encounter (Signed)
    Lana visits pt today and notice that pt's o2 is at 87-88% and if she does any exertion o2 drops. She wanted to ask if Dr. Raliegh Ip can check if pt is eligible for home o2, she said the pcp wont check it for the pt

## 2021-04-08 NOTE — Telephone Encounter (Signed)
Left message for home health nurse to return call to inform her of Dr.Tobb's instruction. However the patient already did plan on reaching back out to pcp the first time we spoke.

## 2021-04-08 NOTE — Telephone Encounter (Signed)
Lana returning call. Line disconnected before I could reach out to the nurse.

## 2021-04-08 NOTE — Telephone Encounter (Signed)
[facility] states that the [facility] is assisting in helping the pt get her oxygen.

## 2021-04-08 NOTE — Telephone Encounter (Signed)
Please let the patient's nurse Lana know that she should call the PCP office as soon as possible to discuss with them about setting up oxygen for the patient.

## 2021-04-09 ENCOUNTER — Encounter: Payer: Self-pay | Admitting: Cardiology

## 2021-04-09 ENCOUNTER — Ambulatory Visit: Payer: Medicare PPO | Admitting: Cardiology

## 2021-04-09 ENCOUNTER — Other Ambulatory Visit: Payer: Self-pay

## 2021-04-09 VITALS — BP 130/56 | HR 55 | Ht 64.0 in | Wt 110.0 lb

## 2021-04-09 DIAGNOSIS — I5032 Chronic diastolic (congestive) heart failure: Secondary | ICD-10-CM | POA: Diagnosis not present

## 2021-04-09 DIAGNOSIS — E119 Type 2 diabetes mellitus without complications: Secondary | ICD-10-CM

## 2021-04-09 DIAGNOSIS — I350 Nonrheumatic aortic (valve) stenosis: Secondary | ICD-10-CM

## 2021-04-09 DIAGNOSIS — D469 Myelodysplastic syndrome, unspecified: Secondary | ICD-10-CM

## 2021-04-09 DIAGNOSIS — I509 Heart failure, unspecified: Secondary | ICD-10-CM | POA: Insufficient documentation

## 2021-04-09 NOTE — Progress Notes (Signed)
Cardiology Consultation:    Date:  04/09/2021   ID:  Stefanie Phelps, DOB October 04, 1930, MRN [id number]  PCP:  Penelope Coop, FNP  Cardiologist:  Jenne Campus, MD   Referring MD: Penelope Coop, FNP   Chief Complaint  Patient presents with  . Congestive Heart Failure  . Shortness of Breath    Fatigue     History of Present Illness:    Stefanie Phelps is a 87 y.o. female who is being seen today for the evaluation of congestive heart failure at the request of Penelope Coop, FNP.  Past medical history significant for essential hypertension, diabetes, dyslipidemia, COPD, diastolic congestive heart failure.  Recently she ended up coming to hospital because of shortness of breath.  She was diagnosed with diastolic congestive heart failure manage appropriately and she was discharged home.  Also does have aortic stenosis which appears to be moderate.  She was referred to [place] to be establish as a patient for management of complex cardiac issues.  She comes today to my office with her grandson.  She is on the wheelchair both of them said that she is doing quite well she does have some exertional shortness of breath fatigue tiredness but she jokingly tells me that she is 87 years old just turned 29 in 41 of this month.  Now she consider all this issue and she says she is doing quite well.  There is no proximal nocturnal dyspnea there is no chest pain tightness palpitation squeezing pressure burning in the chest.  There is no swelling of lower extremities.  Past Medical History:  Diagnosis Date  . Adult idiopathic generalized osteoporosis   . AKI (acute kidney injury) ([place]) 01/16/2021  . Ambulatory dysfunction   . Anemia associated with myeloproliferative disorder treated with erythropoietin ([place])   . Anemia, unspecified   . Anxiety   . Aortic stenosis   . Benign essential hypertension   . CHF (congestive heart failure) ([place])   . Chronic kidney disease (CKD), stage III (moderate) (HCC)   .  Closed fracture of multiple ribs of right side 11/01/2013  . COPD (chronic obstructive pulmonary disease) ([place])   . COVID-19   . Dehydration 01/16/2021  . Diabetes mellitus type 2 in nonobese ([place]) 01/16/2021  . Diarrhea 01/16/2021  . Diverticulitis   . Dyspnea   . Falls   . Gout   . Hyperlipidemia   . Hypokalemia 01/16/2021  . Insomnia   . Lumbago   . Myelodysplastic disease ([place])   . Myelodysplastic syndrome ([place])   . Pneumothorax, right 11/01/2013  . Psoriasis   . 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 Vitamin B12 deficiency   . Vitamin D deficiency     Past Surgical History:  Procedure Laterality Date  . ABDOMINAL HYSTERECTOMY    . CARPAL TUNNEL RELEASE    . CHOLECYSTECTOMY    . FOOT SURGERY Right   . JOINT REPLACEMENT     Hands  . SHOULDER SURGERY    . SKIN BIOPSY      Current Medications: Current Meds  Medication Sig  . Adalimumab 40 MG/0.4ML PNKT Inject 40 mg into the skin every Sunday.   Marland Kitchen albuterol (VENTOLIN HFA) 108 (90 Base) MCG/ACT inhaler Inhale 2 puffs into the lungs every 4 (four) hours as needed for wheezing or shortness of breath.  . allopurinol (ZYLOPRIM) 300 MG tablet Take 300 mg by mouth daily. Take 1/2 to 1 tablet daily  . aspirin EC 81  MG tablet Take 81 mg by mouth every other day. Swallow whole.  Marland Kitchen CLOBETASOL PROPIONATE EMULSION EX Apply 1 application topically 2 (two) times daily as needed (legs). Unknown strength  . Cyanocobalamin 1000 MCG/ML KIT Inject 1,000 mcg as directed every 30 (thirty) days.  . enalapril (VASOTEC) 20 MG tablet Take 1 tablet (20 mg total) by mouth daily.  . furosemide (LASIX) 20 MG tablet Take 1 tablet (20 mg total) by mouth daily. (Patient taking differently: Take 20 mg by mouth as needed for fluid or edema.)  . HYDROcodone-acetaminophen (NORCO) 10-325 MG tablet Take 1 tablet by mouth every 6 (six) hours as needed for moderate pain.  Marland Kitchen lovastatin (MEVACOR) 40 MG tablet Take 40 mg by mouth daily.  .  metoprolol succinate (TOPROL-XL) 25 MG 24 hr tablet Take 1 tablet (25 mg total) by mouth daily.  Marland Kitchen triamcinolone ointment (KENALOG) 0.1 % Apply 1 application topically 2 (two) times daily as needed (psoriasis).  . Vitamin D, Ergocalciferol, (DRISDOL) 50000 units CAPS capsule Take 50,000 Units by mouth every Sunday.      Allergies:   Patient has no known allergies.   Social History   Socioeconomic History  . Marital status: Widowed    Spouse name: Not on file  . Number of children: Not on file  . Years of education: Not on file  . Highest education level: Not on file  Occupational History  . Not on file  Tobacco Use  . Smoking status: Former Research scientist (life sciences)  . Smokeless tobacco: Never Used  Substance and Sexual Activity  . Alcohol use: No  . Drug use: No  . Sexual activity: Not on file  Other Topics Concern  . Not on file  Social History Narrative  . Not on file   Social Determinants of Health   Financial Resource Strain: Not on file  Food Insecurity: Not on file  Transportation Needs: Not on file  Physical Activity: Not on file  Stress: Not on file  Social Connections: Not on file     Family History: The patient's family history includes Alzheimer's disease in her brother; Diabetes in her father and mother; Heart disease in her brother and mother; Hypertension in her father and mother; Lung cancer in her brother; Prostate cancer in her father. ROS:   Please see the history of present illness.    All 14 point review of systems negative except as described per history of present illness.  EKGs/Labs/Other Studies Reviewed:    The following studies were reviewed today: Record from hospital reviewed which include echocardiogram showing left ventricle hypertrophy which was mild, preserved left ventricle ejection fraction, pseudo normal left ventricle filling pattern, severely enlarged left atrium, she did have aortic stenosis with mean gradient of 22 calculated aortic valve area was  0.87 however dimension index was 0.27.  EKG:  EKG is  ordered today.  The ekg ordered today demonstrates normal sinus rhythm, normal P interval, poor R wave progression anterior precordium, nonspecific ST segment changes  Recent Labs: 01/17/2021: TSH 0.358 01/19/2021: Magnesium 1.8 04/01/2021: ALT 18; BUN 38; Creatinine 1.4; Hemoglobin 11.4; Platelets 257; Potassium 3.7; Sodium 141  Recent Lipid Panel No results found for: CHOL, TRIG, HDL, CHOLHDL, VLDL, LDLCALC, LDLDIRECT  Physical Exam:    VS:  BP (!) 130/56 (BP Location: Left Arm, Patient Position: Sitting)   Pulse (!) 55   Ht 5' 4"  (1.626 m)   Wt 110 lb (49.9 kg)   SpO2 90%   BMI 18.88 kg/m  Wt Readings from Last 3 Encounters:  04/09/21 110 lb (49.9 kg)  04/03/21 112 lb 8 oz (51 kg)  04/01/21 110 lb (49.9 kg)     GEN:  Well nourished, well developed in no acute distress HEENT: Normal NECK: No JVD; No carotid bruits LYMPHATICS: No lymphadenopathy CARDIAC: RRR, tones are very distant, there is systolic ejection murmur grade 1/6 to 2/6 best heard right upper portion of the sternum, S2 is still present, no rubs, no gallops RESPIRATORY:  Clear to auscultation without rales, wheezing or rhonchi  ABDOMEN: Soft, non-tender, non-distended MUSCULOSKELETAL:  No edema; No deformity  SKIN: Warm and dry NEUROLOGIC:  Alert and oriented x 3 PSYCHIATRIC:  Normal affect   ASSESSMENT:    1. Nonrheumatic aortic valve stenosis   2. Chronic diastolic congestive heart failure ([place])   3. Diabetes mellitus type 2 in nonobese (HCC)   4. Myelodysplastic syndrome ([place])    PLAN:    In order of problems listed above:  1. Nonrheumatic aortic valve stenosis which in my opinion is moderate.  Her mean gradient is 22 calculated arctic valve area 0.87 however dimensionless index 1.27, because of her overall fragility will simply monitoring her.  She appears to be compensated from CHF point of view.  We will continue present management. 2. Chronic  congestive heart failure seems to be compensated we will continue present management we will check proBNP as well as Chem-7 today. 3. Type 2 diabetes that being followed by antimedicine team.  Stable. 4. Myelodysplastic syndrome follow-up by oncology/hematology.  I reviewed extensive records from the hospital for that visit. Medication Adjustments/Labs and Tests Ordered: Current medicines are reviewed at length with the patient today.  Concerns regarding medicines are outlined above.  No orders of the defined types were placed in this encounter.  No orders of the defined types were placed in this encounter.   Signed, Park Liter, MD, [facility]. 04/09/2021 10:54 AM    [place]

## 2021-04-09 NOTE — Patient Instructions (Signed)
Medication Instructions:  Your physician recommends that you continue on your current medications as directed. Please refer to the Current Medication list given to you today.  *If you need a refill on your cardiac medications before your next appointment, please call your pharmacy*   Lab Work: Your physician recommends that you return for lab work today: BMP, PROBNP   If you have labs (blood work) drawn today and your tests are completely normal, you will receive your results only by: Marland Kitchen MyChart Message (if you have MyChart) OR . A paper copy in the mail If you have any lab test that is abnormal or we need to change your treatment, we will call you to review the results.   Testing/Procedures: NOne   Follow-Up: At [facility], you and your health needs are our priority.  As part of our continuing mission to provide you with exceptional heart care, we have created designated Provider Care Teams.  These Care Teams include your primary Cardiologist (physician) and Advanced Practice Providers (APPs -  Physician Assistants and Nurse Practitioners) who all work together to provide you with the care you need, when you need it.  We recommend signing up for the patient portal called "MyChart".  Sign up information is provided on this After Visit Summary.  MyChart is used to connect with patients for Virtual Visits (Telemedicine).  Patients are able to view lab/test results, encounter notes, upcoming appointments, etc.  Non-urgent messages can be sent to your provider as well.   To learn more about what you can do with MyChart, go to [email or web address].    Your next appointment:   3 month(s)  The format for your next appointment:   In Person  Provider:   Jenne Campus, MD   Other Instructions

## 2021-04-10 LAB — BASIC METABOLIC PANEL
BUN/Creatinine Ratio: 17 (ref 12–28)
BUN: 21 mg/dL (ref 8–27)
CO2: 23 mmol/L (ref 20–29)
Calcium: 9.2 mg/dL (ref 8.7–10.3)
Chloride: 103 mmol/L (ref 96–106)
Creatinine, Ser: 1.25 mg/dL — ABNORMAL HIGH (ref 0.57–1.00)
Glucose: 113 mg/dL — ABNORMAL HIGH (ref 65–99)
Potassium: 3.7 mmol/L (ref 3.5–5.2)
Sodium: 141 mmol/L (ref 134–144)
eGFR: 41 mL/min/{1.73_m2} — ABNORMAL LOW (ref >59)

## 2021-04-10 LAB — PRO B NATRIURETIC PEPTIDE: NT-Pro BNP: 1244 pg/mL — ABNORMAL HIGH (ref 0–738)

## 2021-04-14 ENCOUNTER — Telehealth: Payer: Self-pay | Admitting: Cardiology

## 2021-04-14 DIAGNOSIS — L4050 Arthropathic psoriasis, unspecified: Secondary | ICD-10-CM | POA: Diagnosis not present

## 2021-04-14 DIAGNOSIS — E785 Hyperlipidemia, unspecified: Secondary | ICD-10-CM | POA: Diagnosis not present

## 2021-04-14 DIAGNOSIS — C946 Myelodysplastic disease, not classified: Secondary | ICD-10-CM | POA: Diagnosis not present

## 2021-04-14 DIAGNOSIS — I10 Essential (primary) hypertension: Secondary | ICD-10-CM | POA: Diagnosis not present

## 2021-04-14 DIAGNOSIS — M109 Gout, unspecified: Secondary | ICD-10-CM | POA: Diagnosis not present

## 2021-04-14 DIAGNOSIS — E538 Deficiency of other specified B group vitamins: Secondary | ICD-10-CM | POA: Diagnosis not present

## 2021-04-14 DIAGNOSIS — J449 Chronic obstructive pulmonary disease, unspecified: Secondary | ICD-10-CM | POA: Diagnosis not present

## 2021-04-14 DIAGNOSIS — N183 Chronic kidney disease, stage 3 unspecified: Secondary | ICD-10-CM | POA: Diagnosis not present

## 2021-04-14 DIAGNOSIS — E118 Type 2 diabetes mellitus with unspecified complications: Secondary | ICD-10-CM | POA: Diagnosis not present

## 2021-04-14 NOTE — Telephone Encounter (Signed)
-----   Message from Park Liter, MD sent at 04/13/2021 11:04 AM EDT ----- Labs acceptable, continue present management

## 2021-04-14 NOTE — Telephone Encounter (Signed)
Patient notified of test results 

## 2021-04-14 NOTE — Telephone Encounter (Signed)
Follow Up:     Pt is returning a call from yesterday, concerning her results

## 2021-04-22 ENCOUNTER — Other Ambulatory Visit: Payer: Self-pay

## 2021-04-22 ENCOUNTER — Encounter: Payer: Self-pay | Admitting: Hematology and Oncology

## 2021-04-22 ENCOUNTER — Inpatient Hospital Stay: Payer: Medicare PPO | Attending: Oncology

## 2021-04-22 DIAGNOSIS — D461 Refractory anemia with ring sideroblasts: Secondary | ICD-10-CM

## 2021-04-22 DIAGNOSIS — D649 Anemia, unspecified: Secondary | ICD-10-CM | POA: Diagnosis not present

## 2021-04-22 DIAGNOSIS — J4530 Mild persistent asthma, uncomplicated: Secondary | ICD-10-CM | POA: Diagnosis not present

## 2021-04-22 DIAGNOSIS — R0600 Dyspnea, unspecified: Secondary | ICD-10-CM | POA: Diagnosis not present

## 2021-04-22 LAB — CBC AND DIFFERENTIAL
HCT: 35 — AB (ref 36–46)
Hemoglobin: 11.5 — AB (ref 12.0–16.0)
Neutrophils Absolute: 2.86
Platelets: 245 (ref 150–399)
WBC: 5.2

## 2021-04-22 LAB — BASIC METABOLIC PANEL
BUN: 28 — AB (ref 4–21)
CO2: 26 — AB (ref 13–22)
Chloride: 105 (ref 99–108)
Creatinine: 1.4 — AB (ref 0.5–1.1)
Glucose: 116
Potassium: 4.3 (ref 3.4–5.3)
Sodium: 137 (ref 137–147)

## 2021-04-22 LAB — HEPATIC FUNCTION PANEL
ALT: 15 (ref 7–35)
AST: 33 (ref 13–35)
Alkaline Phosphatase: 80 (ref 25–125)
Bilirubin, Total: 0.7

## 2021-04-22 LAB — COMPREHENSIVE METABOLIC PANEL
Albumin: 3.9 (ref 3.5–5.0)
Calcium: 9 (ref 8.7–10.7)

## 2021-04-22 LAB — CBC: RBC: 3.43 — AB (ref 3.87–5.11)

## 2021-04-23 ENCOUNTER — Telehealth: Payer: Self-pay | Admitting: Cardiology

## 2021-04-23 ENCOUNTER — Other Ambulatory Visit: Payer: Self-pay | Admitting: Pharmacist

## 2021-04-23 ENCOUNTER — Telehealth: Payer: Self-pay | Admitting: Oncology

## 2021-04-23 DIAGNOSIS — I509 Heart failure, unspecified: Secondary | ICD-10-CM | POA: Diagnosis not present

## 2021-04-23 DIAGNOSIS — Z87891 Personal history of nicotine dependence: Secondary | ICD-10-CM | POA: Diagnosis not present

## 2021-04-23 DIAGNOSIS — Z79899 Other long term (current) drug therapy: Secondary | ICD-10-CM | POA: Diagnosis not present

## 2021-04-23 DIAGNOSIS — Z8616 Personal history of COVID-19: Secondary | ICD-10-CM | POA: Diagnosis not present

## 2021-04-23 DIAGNOSIS — E119 Type 2 diabetes mellitus without complications: Secondary | ICD-10-CM | POA: Diagnosis not present

## 2021-04-23 DIAGNOSIS — D469 Myelodysplastic syndrome, unspecified: Secondary | ICD-10-CM | POA: Diagnosis not present

## 2021-04-23 DIAGNOSIS — I110 Hypertensive heart disease with heart failure: Secondary | ICD-10-CM | POA: Diagnosis not present

## 2021-04-23 DIAGNOSIS — I160 Hypertensive urgency: Secondary | ICD-10-CM | POA: Diagnosis not present

## 2021-04-23 DIAGNOSIS — J441 Chronic obstructive pulmonary disease with (acute) exacerbation: Secondary | ICD-10-CM | POA: Diagnosis not present

## 2021-04-23 NOTE — Telephone Encounter (Signed)
I have [facility] calling from Dr. Maxie Barb office wanting to having an echo faxed over to them. The fax number is [phone] ATTN: [facility]

## 2021-04-23 NOTE — Telephone Encounter (Signed)
[facility] informed her we did not order echo she will need to go through medical records at [facility] to get this she understood no further questions.

## 2021-04-23 NOTE — Telephone Encounter (Signed)
04/23/21 Spoke with patient and cancelled inj on 5/13.Gave next appt on 05/13/21.Patient confirmed

## 2021-04-24 ENCOUNTER — Inpatient Hospital Stay: Payer: Medicare PPO

## 2021-05-05 DIAGNOSIS — D469 Myelodysplastic syndrome, unspecified: Secondary | ICD-10-CM | POA: Diagnosis not present

## 2021-05-05 DIAGNOSIS — I509 Heart failure, unspecified: Secondary | ICD-10-CM | POA: Diagnosis not present

## 2021-05-05 DIAGNOSIS — I110 Hypertensive heart disease with heart failure: Secondary | ICD-10-CM | POA: Diagnosis not present

## 2021-05-05 DIAGNOSIS — Z79899 Other long term (current) drug therapy: Secondary | ICD-10-CM | POA: Diagnosis not present

## 2021-05-05 DIAGNOSIS — J441 Chronic obstructive pulmonary disease with (acute) exacerbation: Secondary | ICD-10-CM | POA: Diagnosis not present

## 2021-05-05 DIAGNOSIS — E119 Type 2 diabetes mellitus without complications: Secondary | ICD-10-CM | POA: Diagnosis not present

## 2021-05-05 DIAGNOSIS — Z87891 Personal history of nicotine dependence: Secondary | ICD-10-CM | POA: Diagnosis not present

## 2021-05-05 DIAGNOSIS — Z8616 Personal history of COVID-19: Secondary | ICD-10-CM | POA: Diagnosis not present

## 2021-05-05 DIAGNOSIS — I160 Hypertensive urgency: Secondary | ICD-10-CM | POA: Diagnosis not present

## 2021-05-13 ENCOUNTER — Other Ambulatory Visit: Payer: Self-pay

## 2021-05-13 ENCOUNTER — Inpatient Hospital Stay: Payer: Medicare PPO | Attending: Oncology

## 2021-05-13 ENCOUNTER — Encounter: Payer: Self-pay | Admitting: Oncology

## 2021-05-13 DIAGNOSIS — D649 Anemia, unspecified: Secondary | ICD-10-CM | POA: Diagnosis not present

## 2021-05-13 DIAGNOSIS — D461 Refractory anemia with ring sideroblasts: Secondary | ICD-10-CM | POA: Insufficient documentation

## 2021-05-14 ENCOUNTER — Inpatient Hospital Stay: Payer: Medicare PPO

## 2021-05-14 ENCOUNTER — Other Ambulatory Visit: Payer: Self-pay

## 2021-05-14 VITALS — BP 132/64 | HR 55 | Temp 97.9°F | Resp 18 | Ht 64.0 in | Wt 105.2 lb

## 2021-05-14 DIAGNOSIS — R051 Acute cough: Secondary | ICD-10-CM | POA: Diagnosis not present

## 2021-05-14 DIAGNOSIS — D461 Refractory anemia with ring sideroblasts: Secondary | ICD-10-CM | POA: Diagnosis not present

## 2021-05-14 MED ORDER — LUSPATERCEPT-AAMT 75 MG ~~LOC~~ SOLR
1.4000 mg/kg | Freq: Once | SUBCUTANEOUS | Status: AC
  Administered 2021-05-14: 75 mg via SUBCUTANEOUS
  Filled 2021-05-14: qty 1.5

## 2021-05-14 NOTE — Progress Notes (Signed)
1201: PT STABLE AT TIME OF DISCHARGE

## 2021-05-14 NOTE — Patient Instructions (Signed)
Luspatercept injection What is this medicine? LUSPATERCEPT (lus PAT er sept) helps your body make more red blood cells. This medicine is used to treat anemia caused by beta thalassemia or myelodysplastic syndromes. This medicine may be used for other purposes; ask your health care provider or pharmacist if you have questions. COMMON BRAND NAME(S): REBLOZYL What should I tell my health care provider before I take this medicine? They need to know if you have any of these conditions:  cigarette smoker  have had your spleen removed  high blood pressure  history of blood clots  an unusual or allergic reaction to luspatercept, other medicines, foods, dyes or preservatives  pregnant or trying to get pregnant  breast-feeding How should I use this medicine? This medicine is for injection under the skin. It is given by a healthcare professional in a hospital or clinic setting. Talk to your pediatrician about the use of the medicine in children. This medicine is not approved for use in children. Overdosage: If you think you have taken too much of this medicine contact a poison control center or emergency room at once. NOTE: This medicine is only for you. Do not share this medicine with others. What if I miss a dose? Keep appointments for follow-up doses. It is important not to miss your dose. Call your doctor or healthcare professional if you are unable to keep an appointment. What may interact with this medicine? Interactions are not expected. This list may not describe all possible interactions. Give your health care provider a list of all the medicines, herbs, non-prescription drugs, or dietary supplements you use. Also tell them if you smoke, drink alcohol, or use illegal drugs. Some items may interact with your medicine. What should I watch for while using this medicine? Your condition will be monitored carefully while you are receiving this medicine. Do not become pregnant while taking  this medicine or for 3 months after stopping it. Women should inform their healthcare professional if they wish to become pregnant or think they might be pregnant. There is a potential for serious side effects and harm to an unborn child. Talk to your healthcare professional for more information. Do not breast-feed an infant while taking this medicine. You may need blood work done while you are taking this medicine. What side effects may I notice from receiving this medicine? Side effects that you should report to your doctor or health care professional as soon as possible:  allergic reactions like skin rash, itching or hives; swelling of the face, lips, or tongue  signs and symptoms of a blood clot such as chest pain; shortness of breath; pain, swelling, or warmth in the leg  signs and symptoms of a stroke like changes in vision; confusion; trouble speaking or understanding; severe headaches; sudden numbness or weakness of the face, arm or leg; trouble walking; dizziness; loss of balance or coordination Side effects that usually do not require medical attention (report these to your doctor or health care professional if they continue or are bothersome):  cough  diarrhea  dizziness  headache  joint pain  stomach pain  tiredness This list may not describe all possible side effects. Call your doctor for medical advice about side effects. You may report side effects to FDA at 1-800-FDA-1088. Where should I keep my medicine? This medicine is given in a hospital or clinic and will not be stored at home. NOTE: This sheet is a summary. It may not cover all possible information. If you have questions about   this medicine, talk to your doctor, pharmacist, or health care provider.  2021 Elsevier/Gold Standard (2019-03-20 15:57:59)

## 2021-05-15 ENCOUNTER — Inpatient Hospital Stay: Payer: Medicare PPO

## 2021-05-15 DIAGNOSIS — L409 Psoriasis, unspecified: Secondary | ICD-10-CM | POA: Diagnosis not present

## 2021-05-15 DIAGNOSIS — I10 Essential (primary) hypertension: Secondary | ICD-10-CM | POA: Diagnosis not present

## 2021-05-15 DIAGNOSIS — J449 Chronic obstructive pulmonary disease, unspecified: Secondary | ICD-10-CM | POA: Diagnosis not present

## 2021-05-15 DIAGNOSIS — C946 Myelodysplastic disease, not classified: Secondary | ICD-10-CM | POA: Diagnosis not present

## 2021-05-15 DIAGNOSIS — L4050 Arthropathic psoriasis, unspecified: Secondary | ICD-10-CM | POA: Diagnosis not present

## 2021-05-15 DIAGNOSIS — I50810 Right heart failure, unspecified: Secondary | ICD-10-CM | POA: Diagnosis not present

## 2021-05-21 ENCOUNTER — Encounter: Payer: Self-pay | Admitting: Oncology

## 2021-05-22 ENCOUNTER — Telehealth: Payer: Self-pay | Admitting: Cardiology

## 2021-05-22 NOTE — Telephone Encounter (Signed)
PT is calling with questions about her arm band she states that it has not been picked up yet

## 2021-05-22 NOTE — Telephone Encounter (Signed)
Spoke with Phelps who states that Stefanie Phelps has not picked up the "arm band". Called [facility] and they stated they were unable to speak with the Phelps yesterday. They will call the Phelps and arrange pickup today.

## 2021-05-25 DIAGNOSIS — R0600 Dyspnea, unspecified: Secondary | ICD-10-CM | POA: Diagnosis not present

## 2021-05-29 DIAGNOSIS — J4530 Mild persistent asthma, uncomplicated: Secondary | ICD-10-CM | POA: Diagnosis not present

## 2021-06-03 ENCOUNTER — Other Ambulatory Visit: Payer: Self-pay

## 2021-06-03 ENCOUNTER — Other Ambulatory Visit: Payer: Self-pay | Admitting: Pharmacist

## 2021-06-03 ENCOUNTER — Inpatient Hospital Stay: Payer: Medicare PPO

## 2021-06-03 DIAGNOSIS — D461 Refractory anemia with ring sideroblasts: Secondary | ICD-10-CM

## 2021-06-03 DIAGNOSIS — D649 Anemia, unspecified: Secondary | ICD-10-CM | POA: Diagnosis not present

## 2021-06-04 ENCOUNTER — Inpatient Hospital Stay: Payer: Medicare PPO

## 2021-06-04 ENCOUNTER — Telehealth: Payer: Self-pay | Admitting: Physician Assistant

## 2021-06-04 VITALS — BP 107/63 | HR 71 | Temp 98.1°F | Resp 18 | Ht 64.0 in

## 2021-06-04 DIAGNOSIS — D461 Refractory anemia with ring sideroblasts: Secondary | ICD-10-CM

## 2021-06-04 MED ORDER — LUSPATERCEPT-AAMT 75 MG ~~LOC~~ SOLR
1.4000 mg/kg | Freq: Once | SUBCUTANEOUS | Status: AC
Start: 2021-06-04 — End: 2021-06-04
  Administered 2021-06-04: 65 mg via SUBCUTANEOUS
  Filled 2021-06-04: qty 1.3

## 2021-06-04 MED ORDER — SODIUM CHLORIDE 0.9 % IV SOLN
Freq: Once | INTRAVENOUS | Status: DC | PRN
  Filled 2021-06-04: qty 250

## 2021-06-04 NOTE — Telephone Encounter (Signed)
Ms. Stefanie Phelps presented to the infusion center to receive Luspatercept injection. Vitals were check and BP was 73/37. Patient notes that she took metoprolol 25 mg dose earlier today. She has fatigue and some dizziness. Patient received 1 L of IV fluids and repeat BP was 107/63. Proceed with Luspatarecept injection. I called patient's PCP, Marlena Clipper FNP, and discussed findings. I advised patient to hold metoprolol moving forward until patient's PCP follows up with her. In addition, I advised her to check her BP daily.   Patient expressed understanding of the plan provided.

## 2021-06-04 NOTE — Progress Notes (Signed)
1444 The patient was discharged home. The patient stated that she felt so much better. Her V/S/S were better also, blood pressure was 107/63. I informed her again to not take the metoprolol until her primary physician tells her to start back. The patient and her grandson verbalized understanding.

## 2021-06-04 NOTE — Patient Instructions (Signed)

## 2021-06-04 NOTE — Progress Notes (Signed)
1303 The patient was here today for a reblozyl shot. The patient blood pressure was very low 73/37 we rechecked it in the other arm with blood pressure reading at 72/42. I immediately informed Lyanne Co, PA to assess the patient. Murray Hodgkins assessed patient and put in orders for 1 liter normal saline. I will reassess the patient after the liter goes in then possibly continue with the reblozyl shot. The patient was instructed and her grandson was informed to hold her metoprolol until further notice from her primary doctor. Both verbalized understanding of the above.

## 2021-06-05 ENCOUNTER — Encounter: Payer: Self-pay | Admitting: Oncology

## 2021-06-16 DIAGNOSIS — C946 Myelodysplastic disease, not classified: Secondary | ICD-10-CM | POA: Diagnosis not present

## 2021-06-16 DIAGNOSIS — I10 Essential (primary) hypertension: Secondary | ICD-10-CM | POA: Diagnosis not present

## 2021-06-16 DIAGNOSIS — L409 Psoriasis, unspecified: Secondary | ICD-10-CM | POA: Diagnosis not present

## 2021-06-16 DIAGNOSIS — L4050 Arthropathic psoriasis, unspecified: Secondary | ICD-10-CM | POA: Diagnosis not present

## 2021-06-16 DIAGNOSIS — J449 Chronic obstructive pulmonary disease, unspecified: Secondary | ICD-10-CM | POA: Diagnosis not present

## 2021-06-16 DIAGNOSIS — I50810 Right heart failure, unspecified: Secondary | ICD-10-CM | POA: Diagnosis not present

## 2021-06-23 NOTE — Progress Notes (Signed)
[facility]  [address] [place],  [place]  [address] [phone]  Clinic Day:  06/24/2021  Referring physician: Penelope Coop, FNP  This document serves as a record of services personally performed by Marice Potter, MD. It was created on their behalf by Curry,Lauren E, a trained medical scribe. The creation of this record is based on the scribe's personal observations and the provider's statements to them.  HISTORY OF PRESENT ILLNESS:  The patient is an 87 y.o. female with myelodysplasia-refractory anemia with ring sideroblasts.  The patient has been taking luspatercept over these past months, with the goal being to keep her hemoglobin above 10.  She comes in today to reassess her myelodysplasia.  Since her last visit, the patient has been doing okay.  She believes she has been weaker over these past weeks.   PHYSICAL EXAM:  Blood pressure (!) 123/58, pulse 80, temperature 97.9 F (36.6 C), resp. rate 16, height '5\' 4"'  (1.626 m), weight 114 lb (51.7 kg), SpO2 90 %. Wt Readings from Last 3 Encounters:  06/24/21 114 lb (51.7 kg)  05/14/21 105 lb 4 oz (47.7 kg)  04/09/21 110 lb (49.9 kg)   Body mass index is 19.57 kg/m. Performance status (ECOG): 3 Physical Exam Constitutional:      Appearance: Normal appearance. She is ill-appearing (Chronically ill-appearing woman who is in a wheelchair).  HENT:     Mouth/Throat:     Mouth: Mucous membranes are moist.     Pharynx: Oropharynx is clear. No oropharyngeal exudate or posterior oropharyngeal erythema.  Cardiovascular:     Rate and Rhythm: Normal rate and regular rhythm.     Heart sounds: No murmur heard.   No friction rub. No gallop.  Pulmonary:     Effort: Pulmonary effort is normal. No respiratory distress.     Breath sounds: Normal breath sounds. No wheezing, rhonchi or rales.  Chest:  Breasts:    Right: No axillary adenopathy or supraclavicular adenopathy.     Left: No axillary  adenopathy or supraclavicular adenopathy.  Abdominal:     General: Bowel sounds are normal. There is no distension.     Palpations: Abdomen is soft. There is no mass.     Tenderness: There is no abdominal tenderness.  Musculoskeletal:        General: No swelling.     Right lower leg: No edema.     Left lower leg: No edema.  Lymphadenopathy:     Cervical: No cervical adenopathy.     Upper Body:     Right upper body: No supraclavicular or axillary adenopathy.     Left upper body: No supraclavicular or axillary adenopathy.     Lower Body: No right inguinal adenopathy. No left inguinal adenopathy.  Skin:    General: Skin is warm.     Coloration: Skin is not jaundiced.     Findings: No bruising, lesion or rash.  Neurological:     General: No focal deficit present.     Mental Status: She is alert and oriented to person, place, and time. Mental status is at baseline.     Cranial Nerves: Cranial nerves are intact.  Psychiatric:        Mood and Affect: Mood normal.        Behavior: Behavior normal.        Thought Content: Thought content normal.    LABS:   CBC Latest Ref Rng & Units 06/24/2021 04/22/2021 04/01/2021  WBC - 7.6  5.2 5.8  Hemoglobin 12.0 - 16.0 9.6(A) 11.5(A) 11.4(A)  Hematocrit 36 - 46 29(A) 35(A) 37  Platelets 150 - 399 241 245 257   CMP Latest Ref Rng & Units 06/24/2021 04/22/2021 04/09/2021  Glucose 65 - 99 mg/dL - - 113(H)  BUN 4 - 21 27(A) 28(A) 21  Creatinine 0.5 - 1.1 1.3(A) 1.4(A) 1.25(H)  Sodium 137 - 147 139 137 141  Potassium 3.4 - 5.3 3.4 4.3 3.7  Chloride 99 - 108 110(A) 105 103  CO2 13 - 22 19 26(A) 23  Calcium 8.7 - 10.7 8.9 9.0 9.2  Total Protein 6.5 - 8.1 g/dL - - -  Total Bilirubin 0.3 - 1.2 mg/dL - - -  Alkaline Phos 25 - 125 73 80 -  AST 13 - 35 33 33 -  ALT 7 - 35 18 15 -    ASSESSMENT & PLAN:  Assessment/Plan:  An 87 y.o. female with myelodysplasia-RARS.  When evaluating her hemoglobin today, there has been a noticeable drop.  She will  continue receiving luspatercept injections every 3 weeks, but the dose will be brought back up to 1.75 mg/kg, which was the dose that led to an exponential rise in her hemoglobin.  Her CBC will be continued to be followed every 3 weeks.  I will see her back in 12 weeks for repeat clinical assessment.  The patient understands all the plans discussed today and is in agreement with them.      I, Rita Ohara, am acting as scribe for Marice Potter, MD    I have reviewed this report as typed by the medical scribe, and it is complete and accurate.  Dequincy Macarthur Critchley, MD

## 2021-06-24 ENCOUNTER — Encounter: Payer: Self-pay | Admitting: Oncology

## 2021-06-24 ENCOUNTER — Other Ambulatory Visit: Payer: Self-pay | Admitting: Oncology

## 2021-06-24 ENCOUNTER — Telehealth: Payer: Self-pay | Admitting: Oncology

## 2021-06-24 ENCOUNTER — Inpatient Hospital Stay: Payer: Medicare PPO | Attending: Oncology | Admitting: Oncology

## 2021-06-24 ENCOUNTER — Inpatient Hospital Stay: Payer: Medicare PPO

## 2021-06-24 VITALS — BP 123/58 | HR 80 | Temp 97.9°F | Resp 16 | Ht 64.0 in | Wt 114.0 lb

## 2021-06-24 DIAGNOSIS — D649 Anemia, unspecified: Secondary | ICD-10-CM | POA: Diagnosis not present

## 2021-06-24 DIAGNOSIS — D461 Refractory anemia with ring sideroblasts: Secondary | ICD-10-CM

## 2021-06-24 LAB — BASIC METABOLIC PANEL
BUN: 27 — AB (ref 4–21)
CO2: 19 (ref 13–22)
Chloride: 110 — AB (ref 99–108)
Creatinine: 1.3 — AB (ref 0.5–1.1)
Glucose: 107
Potassium: 3.4 (ref 3.4–5.3)
Sodium: 139 (ref 137–147)

## 2021-06-24 LAB — CBC AND DIFFERENTIAL
HCT: 29 — AB (ref 36–46)
Hemoglobin: 9.6 — AB (ref 12.0–16.0)
Neutrophils Absolute: 3.72
Platelets: 241 (ref 150–399)
WBC: 7.6

## 2021-06-24 LAB — HEPATIC FUNCTION PANEL
ALT: 18 (ref 7–35)
AST: 33 (ref 13–35)
Alkaline Phosphatase: 73 (ref 25–125)
Bilirubin, Total: 1.3

## 2021-06-24 LAB — COMPREHENSIVE METABOLIC PANEL
Albumin: 4 (ref 3.5–5.0)
Calcium: 8.9 (ref 8.7–10.7)

## 2021-06-24 LAB — CBC: RBC: 2.83 — AB (ref 3.87–5.11)

## 2021-06-24 NOTE — Telephone Encounter (Signed)
Per 7/13 LOS, patient scheduled for every 3 weeks for Labs, Oct Labs, Follow Up Appt's.  Gave patient Appt Summary

## 2021-06-25 ENCOUNTER — Inpatient Hospital Stay: Payer: Medicare PPO

## 2021-06-25 ENCOUNTER — Other Ambulatory Visit: Payer: Self-pay

## 2021-06-25 VITALS — BP 96/49 | HR 90 | Temp 98.1°F | Resp 18 | Ht 64.0 in | Wt 114.0 lb

## 2021-06-25 DIAGNOSIS — D461 Refractory anemia with ring sideroblasts: Secondary | ICD-10-CM | POA: Diagnosis not present

## 2021-06-25 MED ORDER — LUSPATERCEPT-AAMT 75 MG ~~LOC~~ SOLR
1.7500 mg/kg | Freq: Once | SUBCUTANEOUS | Status: AC
Start: 2021-06-25 — End: 2021-06-25
  Administered 2021-06-25: 85 mg via SUBCUTANEOUS
  Filled 2021-06-25: qty 1.5

## 2021-06-25 NOTE — Patient Instructions (Signed)
Luspatercept injection What is this medication? LUSPATERCEPT (lus PAT er sept) helps your body make more red blood cells. This medicine is used to treat anemia caused by beta thalassemia or myelodysplasticsyndromes. This medicine may be used for other purposes; ask your health care provider orpharmacist if you have questions. COMMON BRAND NAME(S): REBLOZYL What should I tell my care team before I take this medication? They need to know if you have any of these conditions: cigarette smoker have had your spleen removed high blood pressure history of blood clots an unusual or allergic reaction to luspatercept, other medicines, foods, dyes or preservatives pregnant or trying to get pregnant breast-feeding How should I use this medication? This medicine is for injection under the skin. It is given by a healthcareprofessional in a hospital or clinic setting. Talk to your pediatrician about the use of the medicine in children. Thismedicine is not approved for use in children. Overdosage: If you think you have taken too much of this medicine contact apoison control center or emergency room at once. NOTE: This medicine is only for you. Do not share this medicine with others. What if I miss a dose? Keep appointments for follow-up doses. It is important not to miss your dose. Call your doctor or healthcare professional if you are unable to keep anappointment. What may interact with this medication? Interactions are not expected. This list may not describe all possible interactions. Give your health care provider a list of all the medicines, herbs, non-prescription drugs, or dietary supplements you use. Also tell them if you smoke, drink alcohol, or use illegaldrugs. Some items may interact with your medicine. What should I watch for while using this medication? Your condition will be monitored carefully while you are receiving thismedicine. Do not become pregnant while taking this medicine or for 3  months after stopping it. Women should inform their healthcare professional if they wish to become pregnant or think they might be pregnant. There is a potential for serious side effects and harm to an unborn child. Talk to your healthcare professional for more information. Do not breast-feed an infant while takingthis medicine. You may need blood work done while you are taking this medicine. What side effects may I notice from receiving this medication? Side effects that you should report to your doctor or health care professionalas soon as possible: allergic reactions like skin rash, itching or hives; swelling of the face, lips, or tongue signs and symptoms of a blood clot such as chest pain; shortness of breath; pain, swelling, or warmth in the leg signs and symptoms of a stroke like changes in vision; confusion; trouble speaking or understanding; severe headaches; sudden numbness or weakness of the face, arm or leg; trouble walking; dizziness; loss of balance or coordination Side effects that usually do not require medical attention (report these toyour doctor or health care professional if they continue or are bothersome): cough diarrhea dizziness headache joint pain stomach pain tiredness This list may not describe all possible side effects. Call your doctor for medical advice about side effects. You may report side effects to FDA at1-800-FDA-1088. Where should I keep my medication? This medicine is given in a hospital or clinic and will not be stored at home. NOTE: This sheet is a summary. It may not cover all possible information. If you have questions about this medicine, talk to your doctor, pharmacist, orhealth care provider.  2022 Elsevier/Gold Standard (2019-03-20 15:57:59)

## 2021-06-28 DIAGNOSIS — J4530 Mild persistent asthma, uncomplicated: Secondary | ICD-10-CM | POA: Diagnosis not present

## 2021-06-29 ENCOUNTER — Encounter: Payer: Self-pay | Admitting: Oncology

## 2021-07-03 DIAGNOSIS — L409 Psoriasis, unspecified: Secondary | ICD-10-CM | POA: Diagnosis not present

## 2021-07-03 DIAGNOSIS — T148XXA Other injury of unspecified body region, initial encounter: Secondary | ICD-10-CM | POA: Diagnosis not present

## 2021-07-03 DIAGNOSIS — L089 Local infection of the skin and subcutaneous tissue, unspecified: Secondary | ICD-10-CM | POA: Diagnosis not present

## 2021-07-09 ENCOUNTER — Ambulatory Visit: Payer: Medicare PPO | Admitting: Cardiology

## 2021-07-15 ENCOUNTER — Encounter: Payer: Self-pay | Admitting: Hematology and Oncology

## 2021-07-15 ENCOUNTER — Inpatient Hospital Stay: Payer: Medicare PPO | Attending: Oncology

## 2021-07-15 ENCOUNTER — Other Ambulatory Visit: Payer: Self-pay | Admitting: Pharmacist

## 2021-07-15 DIAGNOSIS — D461 Refractory anemia with ring sideroblasts: Secondary | ICD-10-CM | POA: Insufficient documentation

## 2021-07-15 DIAGNOSIS — D649 Anemia, unspecified: Secondary | ICD-10-CM | POA: Diagnosis not present

## 2021-07-15 LAB — CBC AND DIFFERENTIAL
HCT: 29 — AB (ref 36–46)
Hemoglobin: 9 — AB (ref 12.0–16.0)
Neutrophils Absolute: 3.31
Platelets: 263 (ref 150–399)
WBC: 6.9

## 2021-07-15 LAB — BASIC METABOLIC PANEL
BUN: 32 — AB (ref 4–21)
CO2: 19 (ref 13–22)
Chloride: 110 — AB (ref 99–108)
Creatinine: 1.6 — AB (ref 0.5–1.1)
Glucose: 91
Potassium: 3.9 (ref 3.4–5.3)
Sodium: 140 (ref 137–147)

## 2021-07-15 LAB — HEPATIC FUNCTION PANEL
ALT: 18 (ref 7–35)
AST: 35 (ref 13–35)
Alkaline Phosphatase: 66 (ref 25–125)
Bilirubin, Total: 1.1

## 2021-07-15 LAB — COMPREHENSIVE METABOLIC PANEL
Albumin: 4.1 (ref 3.5–5.0)
Calcium: 9.2 (ref 8.7–10.7)

## 2021-07-15 LAB — CBC: RBC: 2.69 — AB (ref 3.87–5.11)

## 2021-07-16 ENCOUNTER — Other Ambulatory Visit: Payer: Self-pay

## 2021-07-16 ENCOUNTER — Inpatient Hospital Stay: Payer: Medicare PPO

## 2021-07-16 VITALS — BP 106/49 | HR 79 | Temp 97.9°F | Resp 18 | Ht 64.0 in | Wt 111.1 lb

## 2021-07-16 DIAGNOSIS — M1990 Unspecified osteoarthritis, unspecified site: Secondary | ICD-10-CM | POA: Diagnosis not present

## 2021-07-16 DIAGNOSIS — D84821 Immunodeficiency due to drugs: Secondary | ICD-10-CM | POA: Diagnosis not present

## 2021-07-16 DIAGNOSIS — G8929 Other chronic pain: Secondary | ICD-10-CM | POA: Diagnosis not present

## 2021-07-16 DIAGNOSIS — D461 Refractory anemia with ring sideroblasts: Secondary | ICD-10-CM

## 2021-07-16 DIAGNOSIS — R32 Unspecified urinary incontinence: Secondary | ICD-10-CM | POA: Diagnosis not present

## 2021-07-16 DIAGNOSIS — I10 Essential (primary) hypertension: Secondary | ICD-10-CM | POA: Diagnosis not present

## 2021-07-16 DIAGNOSIS — J449 Chronic obstructive pulmonary disease, unspecified: Secondary | ICD-10-CM | POA: Diagnosis not present

## 2021-07-16 DIAGNOSIS — M069 Rheumatoid arthritis, unspecified: Secondary | ICD-10-CM | POA: Diagnosis not present

## 2021-07-16 DIAGNOSIS — M109 Gout, unspecified: Secondary | ICD-10-CM | POA: Diagnosis not present

## 2021-07-16 DIAGNOSIS — E785 Hyperlipidemia, unspecified: Secondary | ICD-10-CM | POA: Diagnosis not present

## 2021-07-16 MED ORDER — LUSPATERCEPT-AAMT 75 MG ~~LOC~~ SOLR
1.7500 mg/kg | Freq: Once | SUBCUTANEOUS | Status: AC
  Administered 2021-07-16: 85 mg via SUBCUTANEOUS
  Filled 2021-07-16: qty 1.5

## 2021-07-16 NOTE — Patient Instructions (Signed)
Luspatercept injection What is this medication? LUSPATERCEPT (lus PAT er sept) helps your body make more red blood cells. This medicine is used to treat anemia caused by beta thalassemia or myelodysplasticsyndromes. This medicine may be used for other purposes; ask your health care provider orpharmacist if you have questions. COMMON BRAND NAME(S): REBLOZYL What should I tell my care team before I take this medication? They need to know if you have any of these conditions: cigarette smoker have had your spleen removed high blood pressure history of blood clots an unusual or allergic reaction to luspatercept, other medicines, foods, dyes or preservatives pregnant or trying to get pregnant breast-feeding How should I use this medication? This medicine is for injection under the skin. It is given by a healthcareprofessional in a hospital or clinic setting. Talk to your pediatrician about the use of the medicine in children. Thismedicine is not approved for use in children. Overdosage: If you think you have taken too much of this medicine contact apoison control center or emergency room at once. NOTE: This medicine is only for you. Do not share this medicine with others. What if I miss a dose? Keep appointments for follow-up doses. It is important not to miss your dose. Call your doctor or healthcare professional if you are unable to keep anappointment. What may interact with this medication? Interactions are not expected. This list may not describe all possible interactions. Give your health care provider a list of all the medicines, herbs, non-prescription drugs, or dietary supplements you use. Also tell them if you smoke, drink alcohol, or use illegaldrugs. Some items may interact with your medicine. What should I watch for while using this medication? Your condition will be monitored carefully while you are receiving thismedicine. Do not become pregnant while taking this medicine or for 3  months after stopping it. Women should inform their healthcare professional if they wish to become pregnant or think they might be pregnant. There is a potential for serious side effects and harm to an unborn child. Talk to your healthcare professional for more information. Do not breast-feed an infant while takingthis medicine. You may need blood work done while you are taking this medicine. What side effects may I notice from receiving this medication? Side effects that you should report to your doctor or health care professionalas soon as possible: allergic reactions like skin rash, itching or hives; swelling of the face, lips, or tongue signs and symptoms of a blood clot such as chest pain; shortness of breath; pain, swelling, or warmth in the leg signs and symptoms of a stroke like changes in vision; confusion; trouble speaking or understanding; severe headaches; sudden numbness or weakness of the face, arm or leg; trouble walking; dizziness; loss of balance or coordination Side effects that usually do not require medical attention (report these toyour doctor or health care professional if they continue or are bothersome): cough diarrhea dizziness headache joint pain stomach pain tiredness This list may not describe all possible side effects. Call your doctor for medical advice about side effects. You may report side effects to FDA at1-800-FDA-1088. Where should I keep my medication? This medicine is given in a hospital or clinic and will not be stored at home. NOTE: This sheet is a summary. It may not cover all possible information. If you have questions about this medicine, talk to your doctor, pharmacist, orhealth care provider.  2022 Elsevier/Gold Standard (2019-03-20 15:57:59)

## 2021-07-17 ENCOUNTER — Encounter: Payer: Self-pay | Admitting: Oncology

## 2021-07-20 DIAGNOSIS — C946 Myelodysplastic disease, not classified: Secondary | ICD-10-CM | POA: Diagnosis not present

## 2021-07-20 DIAGNOSIS — L4050 Arthropathic psoriasis, unspecified: Secondary | ICD-10-CM | POA: Diagnosis not present

## 2021-07-29 DIAGNOSIS — J4530 Mild persistent asthma, uncomplicated: Secondary | ICD-10-CM | POA: Diagnosis not present

## 2021-08-05 ENCOUNTER — Inpatient Hospital Stay: Payer: Medicare PPO

## 2021-08-05 ENCOUNTER — Encounter: Payer: Self-pay | Admitting: Oncology

## 2021-08-05 ENCOUNTER — Encounter: Payer: Self-pay | Admitting: Hematology and Oncology

## 2021-08-05 DIAGNOSIS — D649 Anemia, unspecified: Secondary | ICD-10-CM | POA: Diagnosis not present

## 2021-08-05 DIAGNOSIS — D461 Refractory anemia with ring sideroblasts: Secondary | ICD-10-CM

## 2021-08-05 LAB — CBC AND DIFFERENTIAL
HCT: 31 — AB (ref 36–46)
Hemoglobin: 10 — AB (ref 12.0–16.0)
Neutrophils Absolute: 2.6
Platelets: 276 (ref 150–399)
WBC: 5.3

## 2021-08-05 LAB — BASIC METABOLIC PANEL
BUN: 34 — AB (ref 4–21)
CO2: 20 (ref 13–22)
Chloride: 107 (ref 99–108)
Creatinine: 1.5 — AB (ref 0.5–1.1)
Glucose: 114
Potassium: 3.6 (ref 3.4–5.3)
Sodium: 139 (ref 137–147)

## 2021-08-05 LAB — COMPREHENSIVE METABOLIC PANEL
Albumin: 4.2 (ref 3.5–5.0)
Calcium: 9.7 (ref 8.7–10.7)

## 2021-08-05 LAB — HEPATIC FUNCTION PANEL
ALT: 19 (ref 7–35)
AST: 41 — AB (ref 13–35)
Alkaline Phosphatase: 83 (ref 25–125)
Bilirubin, Total: 0.9

## 2021-08-05 LAB — CBC: RBC: 2.9 — AB (ref 3.87–5.11)

## 2021-08-06 ENCOUNTER — Other Ambulatory Visit: Payer: Self-pay

## 2021-08-06 ENCOUNTER — Inpatient Hospital Stay: Payer: Medicare PPO

## 2021-08-06 VITALS — BP 114/43 | HR 64 | Temp 97.9°F | Resp 18 | Ht 64.0 in | Wt 113.0 lb

## 2021-08-06 DIAGNOSIS — D461 Refractory anemia with ring sideroblasts: Secondary | ICD-10-CM

## 2021-08-06 MED ORDER — LUSPATERCEPT-AAMT 75 MG ~~LOC~~ SOLR
1.7500 mg/kg | Freq: Once | SUBCUTANEOUS | Status: AC
  Administered 2021-08-06: 85 mg via SUBCUTANEOUS
  Filled 2021-08-06: qty 1.5

## 2021-08-06 MED ORDER — SODIUM CHLORIDE 0.9 % IV SOLN: Freq: Once | INTRAVENOUS | Status: DC | PRN

## 2021-08-06 NOTE — Patient Instructions (Signed)
Dehydration, Adult Dehydration is condition in which there is not enough water or other fluids in the body. This happens when a person loses more fluids than he or she takes in. Important body parts cannot work right without the right amount of fluids. Anyloss of fluids from the body can cause dehydration. Dehydration can be mild, worse, or very bad. It should be treated right away tokeep it from getting very bad. What are the causes? This condition may be caused by: Conditions that cause loss of water or other fluids, such as: Watery poop (diarrhea). Vomiting. Sweating a lot. Peeing (urinating) a lot. Not drinking enough fluids, especially when you: Are ill. Are doing things that take a lot of energy to do. Other illnesses and conditions, such as fever or infection. Certain medicines, such as medicines that take extra fluid out of the body (diuretics). Lack of safe drinking water. Not being able to get enough water and food. What increases the risk? The following factors may make you more likely to develop this condition: Having a long-term (chronic) illness that has not been treated the right way, such as: Diabetes. Heart disease. Kidney disease. Being 85 years of age or older. Having a disability. Living in a place that is high above the ground or sea (high in altitude). The thinner, dried air causes more fluid loss. Doing exercises that put stress on your body for a long time. What are the signs or symptoms? Symptoms of dehydration depend on how bad it is. Mild or worse dehydration Thirst. Dry lips or dry mouth. Feeling dizzy or light-headed, especially when you stand up from sitting. Muscle cramps. Your body making: Dark pee (urine). Pee may be the color of tea. Less pee than normal. Less tears than normal. Headache. Very bad dehydration Changes in skin. Skin may: Be cold to the touch (clammy). Be blotchy or pale. Not go back to normal right after you lightly pinch it  and let it go. Little or no tears, pee, or sweat. Changes in vital signs, such as: Fast breathing. Low blood pressure. Weak pulse. Pulse that is more than 100 beats a minute when you are sitting still. Other changes, such as: Feeling very thirsty. Eyes that look hollow (sunken). Cold hands and feet. Being mixed up (confused). Being very tired (lethargic) or having trouble waking from sleep. Short-term weight loss. Loss of consciousness. How is this treated? Treatment for this condition depends on how bad it is. Treatment should start right away. Do not wait until your condition gets very bad. Very bad dehydration is an emergency. You will need to go to a hospital. Mild or worse dehydration can be treated at home. You may be asked to: Drink more fluids. Drink an oral rehydration solution (ORS). This drink helps get the right amounts of fluids and salts and minerals in the blood (electrolytes). Very bad dehydration can be treated: With fluids through an IV tube. By getting normal levels of salts and minerals in your blood. This is often done by giving salts and minerals through a tube. The tube is passed through your nose and into your stomach. By treating the root cause. Follow these instructions at home: Oral rehydration solution If told by your doctor, drink an ORS: Make an ORS. Use instructions on the package. Start by drinking small amounts, about  cup (120 mL) every 5-10 minutes. Slowly drink more until you have had the amount that your doctor said to have. Eating and drinking  Drink enough clear fluid to keep your pee pale yellow. If you were told to drink an ORS, finish the ORS first. Then, start slowly drinking other clear fluids. Drink fluids such as: Water. Do not drink only water. Doing that can make the salt (sodium) level in your body get too low. Water from ice chips you suck on. Fruit juice that you have added water to (diluted). Low-calorie sports  drinks. Eat foods that have the right amounts of salts and minerals, such as: Bananas. Oranges. Potatoes. Tomatoes. Spinach. Do not drink alcohol. Avoid: Drinks that have a lot of sugar. These include: High-calorie sports drinks. Fruit juice that you did not add water to. Soda. Caffeine. Foods that are greasy or have a lot of fat or sugar. General instructions Take over-the-counter and prescription medicines only as told by your doctor. Do not take salt tablets. Doing that can make the salt level in your body get too high. Return to your normal activities as told by your doctor. Ask your doctor what activities are safe for you. Keep all follow-up visits as told by your doctor. This is important. Contact a doctor if: You have pain in your belly (abdomen) and the pain: Gets worse. Stays in one place. You have a rash. You have a stiff neck. You get angry or annoyed (irritable) more easily than normal. You are more tired or have a harder time waking than normal. You feel: Weak or dizzy. Very thirsty. Get help right away if you have: Any symptoms of very bad dehydration. Symptoms of vomiting, such as: You cannot eat or drink without vomiting. Your vomiting gets worse or does not go away. Your vomit has blood or green stuff in it. Symptoms that get worse with treatment. A fever. A very bad headache. Problems with peeing or pooping (having a bowel movement), such as: Watery poop that gets worse or does not go away. Blood in your poop (stool). This may cause poop to look black and tarry. Not peeing in 6-8 hours. Peeing only a small amount of very dark pee in 6-8 hours. Trouble breathing. These symptoms may be an emergency. Do not wait to see if the symptoms will go away. Get medical help right away. Call your local emergency services (911 in the [place] Do not drive yourself to the hospital. Summary Dehydration is a condition in which there is not enough water or other fluids  in the body. This happens when a person loses more fluids than he or she takes in. Treatment for this condition depends on how bad it is. Treatment should be started right away. Do not wait until your condition gets very bad. Drink enough clear fluid to keep your pee pale yellow. If you were told to drink an oral rehydration solution (ORS), finish the ORS first. Then, start slowly drinking other clear fluids. Take over-the-counter and prescription medicines only as told by your doctor. Get help right away if you have any symptoms of very bad dehydration. This information is not intended to replace advice given to you by your health care provider. Make sure you discuss any questions you have with your healthcare provider. Document Revised: 07/12/2019 Document Reviewed: 07/12/2019 Elsevier Patient Education  [place] injection What is this medication? LUSPATERCEPT (lus PAT er sept) helps your body make more red blood cells. This medicine is used to treat anemia caused by beta thalassemia or myelodysplastic syndromes. This medicine may be used for other purposes; ask your health care provider or  pharmacist if you have questions. COMMON BRAND NAME(S): REBLOZYL What should I tell my care team before I take this medication? They need to know if you have any of these conditions: cigarette smoker have had your spleen removed high blood pressure history of blood clots an unusual or allergic reaction to luspatercept, other medicines, foods, dyes or preservatives pregnant or trying to get pregnant breast-feeding How should I use this medication? This medicine is for injection under the skin. It is given by a healthcare professional in a hospital or clinic setting. Talk to your pediatrician about the use of the medicine in children. This medicine is not approved for use in children. Overdosage: If you think you have taken too much of this medicine contact a poison control center  or emergency room at once. NOTE: This medicine is only for you. Do not share this medicine with others. What if I miss a dose? Keep appointments for follow-up doses. It is important not to miss your dose. Call your doctor or healthcare professional if you are unable to keep an appointment. What may interact with this medication? Interactions are not expected. This list may not describe all possible interactions. Give your health care provider a list of all the medicines, herbs, non-prescription drugs, or dietary supplements you use. Also tell them if you smoke, drink alcohol, or use illegal drugs. Some items may interact with your medicine. What should I watch for while using this medication? Your condition will be monitored carefully while you are receiving this medicine. Do not become pregnant while taking this medicine or for 3 months after stopping it. Women should inform their healthcare professional if they wish to become pregnant or think they might be pregnant. There is a potential for serious side effects and harm to an unborn child. Talk to your healthcare professional for more information. Do not breast-feed an infant while taking this medicine. You may need blood work done while you are taking this medicine. What side effects may I notice from receiving this medication? Side effects that you should report to your doctor or health care professional as soon as possible: allergic reactions like skin rash, itching or hives; swelling of the face, lips, or tongue signs and symptoms of a blood clot such as chest pain; shortness of breath; pain, swelling, or warmth in the leg signs and symptoms of a stroke like changes in vision; confusion; trouble speaking or understanding; severe headaches; sudden numbness or weakness of the face, arm or leg; trouble walking; dizziness; loss of balance or coordination Side effects that usually do not require medical attention (report these to your doctor or  health care professional if they continue or are bothersome): cough diarrhea dizziness headache joint pain stomach pain tiredness This list may not describe all possible side effects. Call your doctor for medical advice about side effects. You may report side effects to FDA at 1-800-FDA-1088. Where should I keep my medication? This medicine is given in a hospital or clinic and will not be stored at home. NOTE: This sheet is a summary. It may not cover all possible information. If you have questions about this medicine, talk to your doctor, pharmacist, or health care provider.  2022 Elsevier/Gold Standard (2019-03-20 15:57:59)

## 2021-08-07 ENCOUNTER — Encounter: Payer: Self-pay | Admitting: Oncology

## 2021-08-24 ENCOUNTER — Other Ambulatory Visit: Payer: Self-pay | Admitting: Hematology and Oncology

## 2021-08-24 ENCOUNTER — Inpatient Hospital Stay: Payer: Medicare PPO | Attending: Oncology

## 2021-08-24 DIAGNOSIS — D461 Refractory anemia with ring sideroblasts: Secondary | ICD-10-CM | POA: Insufficient documentation

## 2021-08-24 LAB — CBC AND DIFFERENTIAL
HCT: 34 — AB (ref 36–46)
Hemoglobin: 10.6 — AB (ref 12.0–16.0)
Neutrophils Absolute: 3.35
Platelets: 255 (ref 150–399)
WBC: 5.4

## 2021-08-24 LAB — BASIC METABOLIC PANEL
BUN: 19 (ref 4–21)
CO2: 21 (ref 13–22)
Chloride: 105 (ref 99–108)
Creatinine: 1.3 — AB (ref 0.5–1.1)
Glucose: 181
Potassium: 3 — AB (ref 3.4–5.3)
Sodium: 139 (ref 137–147)

## 2021-08-24 LAB — HEPATIC FUNCTION PANEL
ALT: 15 (ref 7–35)
AST: 37 — AB (ref 13–35)
Alkaline Phosphatase: 64 (ref 25–125)
Bilirubin, Total: 0.8

## 2021-08-24 LAB — COMPREHENSIVE METABOLIC PANEL
Albumin: 3.9 (ref 3.5–5.0)
Calcium: 9.1 (ref 8.7–10.7)

## 2021-08-24 LAB — CBC
MCV: 105 — AB (ref 81–99)
RBC: 3.22 — AB (ref 3.87–5.11)

## 2021-08-26 ENCOUNTER — Other Ambulatory Visit: Payer: Medicare PPO

## 2021-08-26 ENCOUNTER — Encounter: Payer: Self-pay | Admitting: Oncology

## 2021-08-26 MED FILL — Luspatercept-aamt For Subcutaneous Inj 75 MG: SUBCUTANEOUS | Qty: 1.7 | Status: AC

## 2021-08-27 ENCOUNTER — Inpatient Hospital Stay: Payer: Medicare PPO

## 2021-08-27 ENCOUNTER — Other Ambulatory Visit: Payer: Self-pay

## 2021-08-27 ENCOUNTER — Other Ambulatory Visit: Payer: Self-pay | Admitting: Hematology and Oncology

## 2021-08-27 VITALS — BP 97/61 | Temp 98.7°F | Resp 18 | Ht 64.0 in | Wt 100.2 lb

## 2021-08-27 DIAGNOSIS — D461 Refractory anemia with ring sideroblasts: Secondary | ICD-10-CM

## 2021-08-27 MED ORDER — POTASSIUM CHLORIDE CRYS ER 10 MEQ PO TBCR: 20.0000 meq | EXTENDED_RELEASE_TABLET | Freq: Every day | ORAL | 3 refills | Status: AC

## 2021-08-27 MED ORDER — LUSPATERCEPT-AAMT 75 MG ~~LOC~~ SOLR
1.7500 mg/kg | Freq: Once | SUBCUTANEOUS | Status: AC
  Administered 2021-08-27: 85 mg via SUBCUTANEOUS
  Filled 2021-08-27: qty 1.5

## 2021-08-27 NOTE — Progress Notes (Signed)
The patient was here today for her reblozyl shot. The patient has lost at least 13lbs. Her blood pressure was low today. The patient stated that the PCP has taken her off her blood pressure medicines. The patient has continued herself to take her lasix for possible swelling. I informed the patient that that was the reason why her potassium was low today at 3.0. I asked her to stop the lasix for now. Melissa was informed of the above and she agreed. Patient understood the above and verbalized understanding.

## 2021-08-27 NOTE — Patient Instructions (Signed)
Luspatercept injection What is this medication? LUSPATERCEPT (lus PAT er sept) helps your body make more red blood cells. This medicine is used to treat anemia caused by beta thalassemia or myelodysplastic syndromes. This medicine may be used for other purposes; ask your health care provider or pharmacist if you have questions. COMMON BRAND NAME(S): REBLOZYL What should I tell my care team before I take this medication? They need to know if you have any of these conditions: cigarette smoker have had your spleen removed high blood pressure history of blood clots an unusual or allergic reaction to luspatercept, other medicines, foods, dyes or preservatives pregnant or trying to get pregnant breast-feeding How should I use this medication? This medicine is for injection under the skin. It is given by a healthcare professional in a hospital or clinic setting. Talk to your pediatrician about the use of the medicine in children. This medicine is not approved for use in children. Overdosage: If you think you have taken too much of this medicine contact a poison control center or emergency room at once. NOTE: This medicine is only for you. Do not share this medicine with others. What if I miss a dose? Keep appointments for follow-up doses. It is important not to miss your dose. Call your doctor or healthcare professional if you are unable to keep an appointment. What may interact with this medication? Interactions are not expected. This list may not describe all possible interactions. Give your health care provider a list of all the medicines, herbs, non-prescription drugs, or dietary supplements you use. Also tell them if you smoke, drink alcohol, or use illegal drugs. Some items may interact with your medicine. What should I watch for while using this medication? Your condition will be monitored carefully while you are receiving this medicine. Do not become pregnant while taking this medicine or  for 3 months after stopping it. Women should inform their healthcare professional if they wish to become pregnant or think they might be pregnant. There is a potential for serious side effects and harm to an unborn child. Talk to your healthcare professional for more information. Do not breast-feed an infant while taking this medicine. You may need blood work done while you are taking this medicine. What side effects may I notice from receiving this medication? Side effects that you should report to your doctor or health care professional as soon as possible: allergic reactions like skin rash, itching or hives; swelling of the face, lips, or tongue signs and symptoms of a blood clot such as chest pain; shortness of breath; pain, swelling, or warmth in the leg signs and symptoms of a stroke like changes in vision; confusion; trouble speaking or understanding; severe headaches; sudden numbness or weakness of the face, arm or leg; trouble walking; dizziness; loss of balance or coordination Side effects that usually do not require medical attention (report these to your doctor or health care professional if they continue or are bothersome): cough diarrhea dizziness headache joint pain stomach pain tiredness This list may not describe all possible side effects. Call your doctor for medical advice about side effects. You may report side effects to FDA at 1-800-FDA-1088. Where should I keep my medication? This medicine is given in a hospital or clinic and will not be stored at home. NOTE: This sheet is a summary. It may not cover all possible information. If you have questions about this medicine, talk to your doctor, pharmacist, or health care provider.  2022 Elsevier/Gold Standard (2019-03-20 15:57:59) Potassium  Ch[place] Injection What is this medication? POTASSIUM CHLORIDE (poe TASS i um KLOOR ide) prevents and treats low levels of potassium in your body. Potassium plays an important role in  maintaining the health of your kidneys, heart, muscles, and nervous system. This medicine may be used for other purposes; ask your health care provider or pharmacist if you have questions. COMMON BRAND NAME(S): PROAMP What should I tell my care team before I take this medication? They need to know if you have any of these conditions: Addison disease Dehydration Diabetes (high blood sugar) Heart disease High levels of potassium in the blood Irregular heartbeat or rhythm Kidney disease Large areas of burned skin An unusual or allergic reaction to potassium, other medications, foods, dyes, or preservatives Pregnant or trying to get pregnant Breast-feeding How should I use this medication? This medication is injected into a vein. It is given in a hospital or clinic setting. Talk to your care team about the use of this medication in children. Special care may be needed. Overdosage: If you think you have taken too much of this medicine contact a poison control center or emergency room at once. NOTE: This medicine is only for you. Do not share this medicine with others. What if I miss a dose? This does not apply. This medication is not for regular use. What may interact with this medication? Do not take this medication with any of the following: Certain diuretics such as spironolactone, triamterene Eplerenone Sodium polystyrene sulfonate This medication may also interact with the following: Certain medications for blood pressure or heart disease like lisinopril, losartan, quinapril, valsartan Medications that lower your chance of fighting infection such as cyclosporine, tacrolimus NSAIDs, medications for pain and inflammation, like ibuprofen or naproxen Other potassium supplements Salt substitutes This list may not describe all possible interactions. Give your health care provider a list of all the medicines, herbs, non-prescription drugs, or dietary supplements you use. Also tell them if  you smoke, drink alcohol, or use illegal drugs. Some items may interact with your medicine. What should I watch for while using this medication? Visit your care team for regular checks on your progress. Tell your care team if your symptoms do not start to get better or if they get worse. You may need blood work while you are taking this medication. Avoid salt substitutes unless you are told otherwise by your care team. What side effects may I notice from receiving this medication? Side effects that you should report to your care team as soon as possible: Allergic reactions-skin rash, itching, hives, swelling of the face, lips, tongue, or throat High potassium level-muscle weakness, fast or irregular heartbeat Side effects that usually do not require medical attention (report to your care team if they continue or are bothersome): Diarrhea Nausea Stomach pain Vomiting This list may not describe all possible side effects. Call your doctor for medical advice about side effects. You may report side effects to FDA at 1-800-FDA-1088. Where should I keep my medication? This medication is given in a hospital or clinic. It will not be stored at home. NOTE: This sheet is a summary. It may not cover all possible information. If you have questions about this medicine, talk to your doctor, pharmacist, or health care provider.  2022 Elsevier/Gold Standard (2021-03-05 11:17:38)

## 2021-08-28 ENCOUNTER — Encounter: Payer: Self-pay | Admitting: Oncology

## 2021-09-09 NOTE — Progress Notes (Signed)
[facility]  [address] [place],  [place]  [address] [phone]  Clinic Day:  09/16/2021  Referring physician: Penelope Coop, FNP  This document serves as a record of services personally performed by Marice Potter, MD. It was created on their behalf by [facility], a trained medical scribe. The creation of this record is based on the scribe's personal observations and the provider's statements to them.  HISTORY OF PRESENT ILLNESS:  The patient is an 87 y.o. female with myelodysplasia-refractory anemia with ring sideroblasts.  The patient has been taking luspatercept over these past months, with the goal being to keep her hemoglobin above 10.  She comes in today to reassess her myelodysplasia.  Since her last visit, the patient has been doing okay.  She has been short of breath over these past few days which had her in the emergency room twice within the past 5 days.  However, her workup showed no ominous pulmonary process.    PHYSICAL EXAM:  Blood pressure 134/68, pulse 87, temperature 98.7 F (37.1 C), resp. rate 16, height '5\' 4"'  (1.626 m), weight 112 lb 11.2 oz (51.1 kg), SpO2 92 %. Wt Readings from Last 3 Encounters:  09/16/21 112 lb 11.2 oz (51.1 kg)  08/27/21 100 lb 4 oz (45.5 kg)  08/06/21 113 lb (51.3 kg)   Body mass index is 19.34 kg/m. Performance status (ECOG): 3 Physical Exam Constitutional:      Appearance: Normal appearance. She is ill-appearing (Chronically ill-appearing woman who is in a wheelchair).  HENT:     Mouth/Throat:     Mouth: Mucous membranes are moist.     Pharynx: Oropharynx is clear. No oropharyngeal exudate or posterior oropharyngeal erythema.  Cardiovascular:     Rate and Rhythm: Normal rate and regular rhythm.     Heart sounds: No murmur heard.   No friction rub. No gallop.  Pulmonary:     Effort: Pulmonary effort is normal. No respiratory distress.     Breath sounds: Normal breath sounds. No wheezing,  rhonchi or rales.  Abdominal:     General: Bowel sounds are normal. There is no distension.     Palpations: Abdomen is soft. There is no mass.     Tenderness: There is no abdominal tenderness.  Musculoskeletal:        General: No swelling.     Right lower leg: No edema.     Left lower leg: No edema.  Lymphadenopathy:     Cervical: No cervical adenopathy.     Upper Body:     Right upper body: No supraclavicular or axillary adenopathy.     Left upper body: No supraclavicular or axillary adenopathy.     Lower Body: No right inguinal adenopathy. No left inguinal adenopathy.  Skin:    General: Skin is warm.     Coloration: Skin is not jaundiced.     Findings: No bruising, lesion or rash.  Neurological:     General: No focal deficit present.     Mental Status: She is alert and oriented to person, place, and time. Mental status is at baseline.     Cranial Nerves: Cranial nerves are intact.  Psychiatric:        Mood and Affect: Mood normal.        Behavior: Behavior normal.        Thought Content: Thought content normal.    LABS:   CBC Latest Ref Rng & Units 09/16/2021 08/24/2021 08/05/2021  WBC -  2.6 5.4 5.3  Hemoglobin 12.0 - 16.0 9.8(A) 10.6(A) 10.0(A)  Hematocrit 36 - 46 31(A) 34(A) 31(A)  Platelets 150 - 399 276 255 276     ASSESSMENT & PLAN:  Assessment/Plan:  An 87 y.o. female with myelodysplasia-RARS.  When evaluating her hemoglobin today, her hemoglobin is just slightly below 10.  I attribute this to her multiple blood draws, particularly as she has been in the emergency rooms multiple times within the past week.  I do believe her luspatercept is working.  She will continue receiving luspatercept injections every 3 weeks, at 1.75 mg/kg.  Her CBC will continued to be followed every 3 weeks.  I will see her back in 12 weeks for repeat clinical assessment.  The patient understands all the plans discussed today and is in agreement with them.      I, Rita Ohara, am acting as  scribe for Marice Potter, MD    I have reviewed this report as typed by the medical scribe, and it is complete and accurate.  Dequincy Macarthur Critchley, MD

## 2021-09-14 ENCOUNTER — Encounter: Payer: Self-pay | Admitting: Oncology

## 2021-09-16 ENCOUNTER — Inpatient Hospital Stay: Payer: Medicare PPO | Attending: Oncology

## 2021-09-16 ENCOUNTER — Other Ambulatory Visit: Payer: Self-pay | Admitting: Hematology and Oncology

## 2021-09-16 ENCOUNTER — Inpatient Hospital Stay (INDEPENDENT_AMBULATORY_CARE_PROVIDER_SITE_OTHER): Payer: Medicare PPO | Admitting: Oncology

## 2021-09-16 ENCOUNTER — Encounter: Payer: Self-pay | Admitting: Oncology

## 2021-09-16 VITALS — BP 134/68 | HR 87 | Temp 98.7°F | Resp 16 | Ht 64.0 in | Wt 112.7 lb

## 2021-09-16 DIAGNOSIS — D469 Myelodysplastic syndrome, unspecified: Secondary | ICD-10-CM

## 2021-09-16 DIAGNOSIS — D461 Refractory anemia with ring sideroblasts: Secondary | ICD-10-CM | POA: Insufficient documentation

## 2021-09-16 LAB — CBC AND DIFFERENTIAL
HCT: 31 — AB (ref 36–46)
Hemoglobin: 9.8 — AB (ref 12.0–16.0)
Neutrophils Absolute: 1.92
Platelets: 276 (ref 150–399)
WBC: 2.6

## 2021-09-16 LAB — CBC: RBC: 2.98 — AB (ref 3.87–5.11)

## 2021-09-16 MED FILL — Luspatercept-aamt For Subcutaneous Inj 75 MG: SUBCUTANEOUS | Qty: 1.7 | Status: AC

## 2021-09-17 ENCOUNTER — Other Ambulatory Visit: Payer: Self-pay

## 2021-09-17 ENCOUNTER — Inpatient Hospital Stay: Payer: Medicare PPO

## 2021-09-17 VITALS — BP 153/66 | HR 64 | Temp 97.7°F | Resp 18 | Ht 64.0 in | Wt 110.0 lb

## 2021-09-17 DIAGNOSIS — D461 Refractory anemia with ring sideroblasts: Secondary | ICD-10-CM | POA: Diagnosis not present

## 2021-09-17 MED ORDER — LUSPATERCEPT-AAMT 75 MG ~~LOC~~ SOLR
1.7500 mg/kg | Freq: Once | SUBCUTANEOUS | Status: AC
Start: 2021-09-17 — End: 2021-09-17
  Administered 2021-09-17: 85 mg via SUBCUTANEOUS
  Filled 2021-09-17: qty 1.5

## 2021-09-17 NOTE — Patient Instructions (Signed)
Luspatercept injection What is this medication? LUSPATERCEPT (lus PAT er sept) helps your body make more red blood cells. This medicine is used to treat anemia caused by beta thalassemia or myelodysplastic syndromes. This medicine may be used for other purposes; ask your health care provider or pharmacist if you have questions. COMMON BRAND NAME(S): REBLOZYL What should I tell my care team before I take this medication? They need to know if you have any of these conditions: cigarette smoker have had your spleen removed high blood pressure history of blood clots an unusual or allergic reaction to luspatercept, other medicines, foods, dyes or preservatives pregnant or trying to get pregnant breast-feeding How should I use this medication? This medicine is for injection under the skin. It is given by a healthcare professional in a hospital or clinic setting. Talk to your pediatrician about the use of the medicine in children. This medicine is not approved for use in children. Overdosage: If you think you have taken too much of this medicine contact a poison control center or emergency room at once. NOTE: This medicine is only for you. Do not share this medicine with others. What if I miss a dose? Keep appointments for follow-up doses. It is important not to miss your dose. Call your doctor or healthcare professional if you are unable to keep an appointment. What may interact with this medication? Interactions are not expected. This list may not describe all possible interactions. Give your health care provider a list of all the medicines, herbs, non-prescription drugs, or dietary supplements you use. Also tell them if you smoke, drink alcohol, or use illegal drugs. Some items may interact with your medicine. What should I watch for while using this medication? Your condition will be monitored carefully while you are receiving this medicine. Do not become pregnant while taking this medicine or  for 3 months after stopping it. Women should inform their healthcare professional if they wish to become pregnant or think they might be pregnant. There is a potential for serious side effects and harm to an unborn child. Talk to your healthcare professional for more information. Do not breast-feed an infant while taking this medicine. You may need blood work done while you are taking this medicine. What side effects may I notice from receiving this medication? Side effects that you should report to your doctor or health care professional as soon as possible: allergic reactions like skin rash, itching or hives; swelling of the face, lips, or tongue signs and symptoms of a blood clot such as chest pain; shortness of breath; pain, swelling, or warmth in the leg signs and symptoms of a stroke like changes in vision; confusion; trouble speaking or understanding; severe headaches; sudden numbness or weakness of the face, arm or leg; trouble walking; dizziness; loss of balance or coordination Side effects that usually do not require medical attention (report these to your doctor or health care professional if they continue or are bothersome): cough diarrhea dizziness headache joint pain stomach pain tiredness This list may not describe all possible side effects. Call your doctor for medical advice about side effects. You may report side effects to FDA at 1-800-FDA-1088. Where should I keep my medication? This medicine is given in a hospital or clinic and will not be stored at home. NOTE: This sheet is a summary. It may not cover all possible information. If you have questions about this medicine, talk to your doctor, pharmacist, or health care provider.  2022 Elsevier/Gold Standard (2019-03-20 15:57:59)

## 2021-10-01 ENCOUNTER — Other Ambulatory Visit: Payer: Self-pay

## 2021-10-01 NOTE — Patient Outreach (Signed)
[facility]) Care Management  10/01/2021  Stefanie Phelps 06/03/1930 [id number]   Referral Date: 09/30/21 Referral Source: [employer] Report Date of Discharge: 09/29/21 Facility:  [place]: [facility]   Referral received. No outreach warranted at this time. Transition of Care will be completed by primary care provider office who will refer to [facility] care management if needed.  Plan: RN CM will close case.  Jone Baseman, RN, MSN [place] [facility] Care Management Coordinator Direct Line [phone] Cell [phone] Toll Free: [phone]  Fax: [phone]

## 2021-10-05 ENCOUNTER — Inpatient Hospital Stay: Payer: Medicare PPO

## 2021-10-05 ENCOUNTER — Other Ambulatory Visit: Payer: Self-pay | Admitting: Hematology and Oncology

## 2021-10-05 DIAGNOSIS — D461 Refractory anemia with ring sideroblasts: Secondary | ICD-10-CM

## 2021-10-05 LAB — CBC AND DIFFERENTIAL
HCT: 31 — AB (ref 36–46)
Hemoglobin: 9.7 — AB (ref 12.0–16.0)
Neutrophils Absolute: 8.27
Platelets: 248 (ref 150–399)
WBC: 9.4

## 2021-10-05 LAB — CBC: RBC: 3.08 — AB (ref 3.87–5.11)

## 2021-10-07 ENCOUNTER — Other Ambulatory Visit: Payer: Medicare PPO

## 2021-10-07 ENCOUNTER — Other Ambulatory Visit: Payer: Self-pay | Admitting: Pharmacist

## 2021-10-08 ENCOUNTER — Inpatient Hospital Stay: Payer: Medicare PPO

## 2021-10-08 ENCOUNTER — Other Ambulatory Visit: Payer: Self-pay

## 2021-10-08 VITALS — BP 109/60 | HR 103 | Temp 97.6°F | Resp 18 | Ht 64.0 in | Wt 113.0 lb

## 2021-10-08 DIAGNOSIS — D461 Refractory anemia with ring sideroblasts: Secondary | ICD-10-CM | POA: Diagnosis not present

## 2021-10-08 MED ORDER — LUSPATERCEPT-AAMT 75 MG ~~LOC~~ SOLR
1.7500 mg/kg | Freq: Once | SUBCUTANEOUS | Status: AC
  Administered 2021-10-08: 85 mg via SUBCUTANEOUS
  Filled 2021-10-08: qty 1.7

## 2021-10-08 NOTE — Patient Instructions (Signed)
Luspatercept injection What is this medication? LUSPATERCEPT (lus PAT er sept) helps your body make more red blood cells. This medicine is used to treat anemia caused by beta thalassemia or myelodysplastic syndromes. This medicine may be used for other purposes; ask your health care provider or pharmacist if you have questions. COMMON BRAND NAME(S): REBLOZYL What should I tell my care team before I take this medication? They need to know if you have any of these conditions: cigarette smoker have had your spleen removed high blood pressure history of blood clots an unusual or allergic reaction to luspatercept, other medicines, foods, dyes or preservatives pregnant or trying to get pregnant breast-feeding How should I use this medication? This medicine is for injection under the skin. It is given by a healthcare professional in a hospital or clinic setting. Talk to your pediatrician about the use of the medicine in children. This medicine is not approved for use in children. Overdosage: If you think you have taken too much of this medicine contact a poison control center or emergency room at once. NOTE: This medicine is only for you. Do not share this medicine with others. What if I miss a dose? Keep appointments for follow-up doses. It is important not to miss your dose. Call your doctor or healthcare professional if you are unable to keep an appointment. What may interact with this medication? Interactions are not expected. This list may not describe all possible interactions. Give your health care provider a list of all the medicines, herbs, non-prescription drugs, or dietary supplements you use. Also tell them if you smoke, drink alcohol, or use illegal drugs. Some items may interact with your medicine. What should I watch for while using this medication? Your condition will be monitored carefully while you are receiving this medicine. Do not become pregnant while taking this medicine or  for 3 months after stopping it. Women should inform their healthcare professional if they wish to become pregnant or think they might be pregnant. There is a potential for serious side effects and harm to an unborn child. Talk to your healthcare professional for more information. Do not breast-feed an infant while taking this medicine. You may need blood work done while you are taking this medicine. What side effects may I notice from receiving this medication? Side effects that you should report to your doctor or health care professional as soon as possible: allergic reactions like skin rash, itching or hives; swelling of the face, lips, or tongue signs and symptoms of a blood clot such as chest pain; shortness of breath; pain, swelling, or warmth in the leg signs and symptoms of a stroke like changes in vision; confusion; trouble speaking or understanding; severe headaches; sudden numbness or weakness of the face, arm or leg; trouble walking; dizziness; loss of balance or coordination Side effects that usually do not require medical attention (report these to your doctor or health care professional if they continue or are bothersome): cough diarrhea dizziness headache joint pain stomach pain tiredness This list may not describe all possible side effects. Call your doctor for medical advice about side effects. You may report side effects to FDA at 1-800-FDA-1088. Where should I keep my medication? This medicine is given in a hospital or clinic and will not be stored at home. NOTE: This sheet is a summary. It may not cover all possible information. If you have questions about this medicine, talk to your doctor, pharmacist, or health care provider.  2022 Elsevier/Gold Standard (2019-03-20 15:57:59)

## 2021-10-25 ENCOUNTER — Other Ambulatory Visit: Payer: Self-pay | Admitting: Oncology

## 2021-10-27 ENCOUNTER — Telehealth: Payer: Self-pay | Admitting: Oncology

## 2021-10-27 NOTE — Telephone Encounter (Signed)
Patient

## 2021-10-28 ENCOUNTER — Other Ambulatory Visit: Payer: Medicare PPO

## 2021-10-29 ENCOUNTER — Ambulatory Visit: Payer: Medicare PPO

## 2021-11-18 ENCOUNTER — Other Ambulatory Visit: Payer: Self-pay | Admitting: Hematology and Oncology

## 2021-11-18 ENCOUNTER — Inpatient Hospital Stay: Payer: Medicare PPO | Attending: Oncology

## 2021-11-18 DIAGNOSIS — D461 Refractory anemia with ring sideroblasts: Secondary | ICD-10-CM | POA: Insufficient documentation

## 2021-11-18 DIAGNOSIS — D469 Myelodysplastic syndrome, unspecified: Secondary | ICD-10-CM

## 2021-11-18 LAB — CBC AND DIFFERENTIAL
HCT: 28 — AB (ref 36–46)
Hemoglobin: 9.5 — AB (ref 12.0–16.0)
Neutrophils Absolute: 2.9
Platelets: 259 (ref 150–399)
WBC: 5

## 2021-11-18 LAB — CBC: RBC: 2.99 — AB (ref 3.87–5.11)

## 2021-11-18 MED FILL — Luspatercept-aamt For Subcutaneous Inj 75 MG: SUBCUTANEOUS | Qty: 1.7 | Status: AC

## 2021-11-19 ENCOUNTER — Encounter: Payer: Self-pay | Admitting: Oncology

## 2021-11-19 ENCOUNTER — Other Ambulatory Visit: Payer: Self-pay

## 2021-11-19 ENCOUNTER — Inpatient Hospital Stay: Payer: Medicare PPO

## 2021-11-19 VITALS — BP 106/65 | HR 90 | Temp 98.1°F | Resp 18 | Wt 112.0 lb

## 2021-11-19 DIAGNOSIS — D461 Refractory anemia with ring sideroblasts: Secondary | ICD-10-CM | POA: Diagnosis not present

## 2021-11-19 MED ORDER — SODIUM CHLORIDE 0.9% FLUSH: 10.0000 mL | INTRAVENOUS | Status: DC | PRN

## 2021-11-19 MED ORDER — LUSPATERCEPT-AAMT 75 MG ~~LOC~~ SOLR
1.7500 mg/kg | Freq: Once | SUBCUTANEOUS | Status: AC
  Administered 2021-11-19: 85 mg via SUBCUTANEOUS
  Filled 2021-11-19: qty 1.5

## 2021-11-19 MED ORDER — HEPARIN SOD (PORK) LOCK FLUSH 100 UNIT/ML IV SOLN: 500.0000 [IU] | Freq: Once | INTRAVENOUS | Status: DC | PRN

## 2021-11-19 NOTE — Patient Instructions (Signed)
Luspatercept Injection What is this medication? LUSPATERCEPT (lus PAT er sept) treats low levels of red blood cells (anemia) in the body in people with beta thalassemia or myelodysplastic syndromes. It works by helping the body make more red blood cells. This medicine may be used for other purposes; ask your health care provider or pharmacist if you have questions. COMMON BRAND NAME(S): REBLOZYL What should I tell my care team before I take this medication? They need to know if you have any of these conditions: Have had your spleen removed High blood pressure History of blood clots Tobacco use An unusual or allergic reaction to luspatercept, other medications, foods, dyes or preservatives Pregnant or trying to get pregnant Breast-feeding How should I use this medication? This medication is for injection under the skin. It is given by your care team in a hospital or clinic setting. Talk to your care team about the use of the medication in children. This medication is not approved for use in children. Overdosage: If you think you have taken too much of this medicine contact a poison control center or emergency room at once. NOTE: This medicine is only for you. Do not share this medicine with others. What if I miss a dose? Keep appointments for follow-up doses. It is important not to miss your dose. Call your care team if you are unable to keep an appointment. What may interact with this medication? Interactions are not expected. This list may not describe all possible interactions. Give your health care provider a list of all the medicines, herbs, non-prescription drugs, or dietary supplements you use. Also tell them if you smoke, drink alcohol, or use illegal drugs. Some items may interact with your medicine. What should I watch for while using this medication? Your condition will be monitored carefully while you are receiving this medication. Talk to your care team if you wish to become  pregnant or think you might be pregnant. This medication can cause serious birth defects. Discuss contraceptive options with your care team. Do not breastfeed while taking this medication. You may need blood work done while you are taking this medication. What side effects may I notice from receiving this medication? Side effects that you should report to your care team as soon as possible: Allergic reactions--skin rash, itching, hives, swelling of the face, lips, tongue, or throat Blood clot--pain, swelling, or warmth in the leg, shortness of breath, chest pain Increase in blood pressure Severe back pain, numbness or weakness of the hands, arms, legs, or feet, loss of coordination, loss of bowel or bladder control Side effects that usually do not require medical attention (report these to your care team if they continue or are bothersome): Bone pain Dizziness Fatigue Headache Joint pain Muscle pain Stomach pain This list may not describe all possible side effects. Call your doctor for medical advice about side effects. You may report side effects to FDA at 1-800-FDA-1088. Where should I keep my medication? This medication is given in a hospital or clinic. It will not be stored at home. NOTE: This sheet is a summary. It may not cover all possible information. If you have questions about this medicine, talk to your doctor, pharmacist, or health care provider.  2022 Elsevier/Gold Standard (2021-06-26 00:00:00)

## 2021-12-05 DIAGNOSIS — I509 Heart failure, unspecified: Secondary | ICD-10-CM | POA: Diagnosis not present

## 2021-12-08 ENCOUNTER — Telehealth: Payer: Self-pay

## 2021-12-08 NOTE — Telephone Encounter (Addendum)
Notified Margie @ [facility] of below. PCP will be attending for pt. ----- Message from Melodye Ped, NP sent at 12/08/2021  3:43 PM EST ----- Regarding: RE: Hospice referral sent by [facility] home health I think he will be fine with that. She is 61. ----- Message ----- From: Dairl Ponder, RN Sent: 12/08/2021   3:41 PM EST To: Melodye Ped, NP Subject: Hospice referral sent by [facility] home health        Received call from [facility]. Pt has been referred to them by [facility] home health. The pt's PCP, Orlinda Blalock has agreed . However, Manus Gunning saw that pt sees Dr Bobby Rumpf as well. She wants to make sure that Dr Bobby Rumpf is ok with pt having Hospice services?   [phone]

## 2021-12-09 ENCOUNTER — Ambulatory Visit: Payer: Medicare PPO | Admitting: Hematology and Oncology

## 2021-12-09 ENCOUNTER — Other Ambulatory Visit: Payer: Medicare PPO

## 2021-12-10 ENCOUNTER — Inpatient Hospital Stay: Payer: Medicare PPO

## 2021-12-10 NOTE — Progress Notes (Signed)
[facility]  [address] [place],  [place]  [address] [phone]  Clinic Day:  12/17/2021  Referring physician: Penelope Coop, FNP  This document serves as a record of services personally performed by Marice Potter, MD. It was created on their behalf by [facility] E, a trained medical scribe. The creation of this record is based on the scribe's personal observations and the provider's statements to them.  HISTORY OF PRESENT ILLNESS:  The patient is an 87 y.o. female with myelodysplasia-refractory anemia with ring sideroblasts.  The patient has been taking luspatercept over these past months, with the goal being to keep her hemoglobin above 10.  She comes in today to reassess her myelodysplasia.  Unfortunately, since her last visit, there has been a progressive decline in her health.  She was recently admitted to the to the hospital for a severe urinary tract infection.  She remains on antibiotics to treat this.  Although she is back home, she has essentially become reliant upon her family for activities of daily living.  She spends most of her day in bed.  Based upon this and all of her other medical issues, her family believes that hospice needs to be brought in for her care.  PHYSICAL EXAM:  Blood pressure 114/61, pulse (!) 121, temperature 97.9 F (36.6 C), resp. rate 14, height '5\' 4"'  (1.626 m), weight 96 lb 1.6 oz (43.6 kg), SpO2 97 %. Wt Readings from Last 3 Encounters:  12/17/21 96 lb 1.6 oz (43.6 kg)  11/19/21 112 lb (50.8 kg)  10/08/21 113 lb (51.3 kg)   Body mass index is 16.5 kg/m. Performance status (ECOG): 3 Physical Exam Constitutional:      Appearance: Normal appearance. She is ill-appearing (Chronically ill-appearing woman who is in a wheelchair).     Comments: She appears more frail versus previous visits  HENT:     Mouth/Throat:     Mouth: Mucous membranes are moist.     Pharynx: Oropharynx is clear. No oropharyngeal exudate  or posterior oropharyngeal erythema.  Cardiovascular:     Rate and Rhythm: Regular rhythm. Tachycardia present.     Heart sounds: No murmur heard.   No friction rub. No gallop.  Pulmonary:     Effort: Pulmonary effort is normal. No respiratory distress.     Breath sounds: Normal breath sounds. No wheezing, rhonchi or rales.  Abdominal:     General: Bowel sounds are normal. There is no distension.     Palpations: Abdomen is soft. There is no mass.     Tenderness: There is no abdominal tenderness.  Musculoskeletal:        General: No swelling.     Right lower leg: No edema.     Left lower leg: No edema.  Lymphadenopathy:     Cervical: No cervical adenopathy.     Upper Body:     Right upper body: No supraclavicular or axillary adenopathy.     Left upper body: No supraclavicular or axillary adenopathy.     Lower Body: No right inguinal adenopathy. No left inguinal adenopathy.  Skin:    General: Skin is warm.     Coloration: Skin is not jaundiced.     Findings: No bruising, lesion or rash.  Neurological:     General: No focal deficit present.     Mental Status: She is alert and oriented to person, place, and time. Mental status is at baseline.  Psychiatric:  Mood and Affect: Mood normal.        Behavior: Behavior normal.        Thought Content: Thought content normal.    LABS:   CBC Latest Ref Rng & Units 12/17/2021 11/18/2021 10/05/2021  WBC - 5.3 5.0 9.4  Hemoglobin 12.0 - 16.0 9.1(A) 9.5(A) 9.7(A)  Hematocrit 36 - 46 28(A) 28(A) 31(A)  Platelets 150 - 399 131(A) 259 248   ASSESSMENT & PLAN:  Assessment/Plan:  An 87 y.o. female with myelodysplasia-RARS.  In clinic today, I had a very candid discussion with the patient.  Although her myelodysplasia is not the most severe subtype, a combination of all of her medical issues has her very ill at the moment.  She definitely appears to be hospice appropriate, particularly as she is in bed most of the day and has relied upon her  family for many of her activities of daily living.  Based upon this, I will consult hospice and have them speak with the patient and her family to figure out the best way to handle her palliative/terminal care moving forward.  No additional Luspatercept injections will be scheduled.  I encouraged the patient and her family to contact us if they have additional questions regarding her overall care.  The patient understands all the plans discussed today and is in agreement with them.      I, Rita Ohara, am acting as scribe for Marice Potter, MD    I have reviewed this report as typed by the medical scribe, and it is complete and accurate.  Olaf Mesa Macarthur Critchley, MD

## 2021-12-17 ENCOUNTER — Inpatient Hospital Stay: Payer: Medicare PPO | Attending: Oncology | Admitting: Oncology

## 2021-12-17 ENCOUNTER — Other Ambulatory Visit: Payer: Self-pay

## 2021-12-17 ENCOUNTER — Other Ambulatory Visit: Payer: Self-pay | Admitting: Oncology

## 2021-12-17 ENCOUNTER — Other Ambulatory Visit: Payer: Self-pay | Admitting: Hematology and Oncology

## 2021-12-17 ENCOUNTER — Inpatient Hospital Stay: Payer: Medicare PPO

## 2021-12-17 ENCOUNTER — Ambulatory Visit: Payer: Medicare PPO

## 2021-12-17 VITALS — BP 114/61 | HR 121 | Temp 97.9°F | Resp 14 | Ht 64.0 in | Wt 96.1 lb

## 2021-12-17 DIAGNOSIS — D461 Refractory anemia with ring sideroblasts: Secondary | ICD-10-CM | POA: Diagnosis not present

## 2021-12-17 DIAGNOSIS — D469 Myelodysplastic syndrome, unspecified: Secondary | ICD-10-CM | POA: Diagnosis not present

## 2021-12-17 LAB — CBC AND DIFFERENTIAL
HCT: 28 — AB (ref 36–46)
Hemoglobin: 9.1 — AB (ref 12.0–16.0)
Neutrophils Absolute: 3.29
Platelets: 131 — AB (ref 150–399)
WBC: 5.3

## 2021-12-17 LAB — CBC
MCV: 93 (ref 81–99)
RBC: 2.97 — AB (ref 3.87–5.11)

## 2022-01-13 DEATH — deceased

## 2022-06-20 IMAGING — CT CT CERVICAL SPINE W/O CM
3 series · 14 of 34 positions shown, 17 images · non-contrast
Comparison: December 31, 2020.

CLINICAL DATA: Head trauma.

EXAM:
CT HEAD WITHOUT CONTRAST
CT MAXILLOFACIAL WITHOUT CONTRAST
CT CERVICAL SPINE WITHOUT CONTRAST
TECHNIQUE: Multidetector CT imaging of the head, cervical spine, and
maxillofacial structures were performed using the standard protocol
without intravenous contrast. Multiplanar CT image reconstructions
of the cervical spine and maxillofacial structures were also
generated.

[Series 5: c spine soft · axial · 0.37mm/px · z∈[+1082,+1198]mm · 6 of 76 slices shown, 8 images]
[im 12/76  soft-tissue]
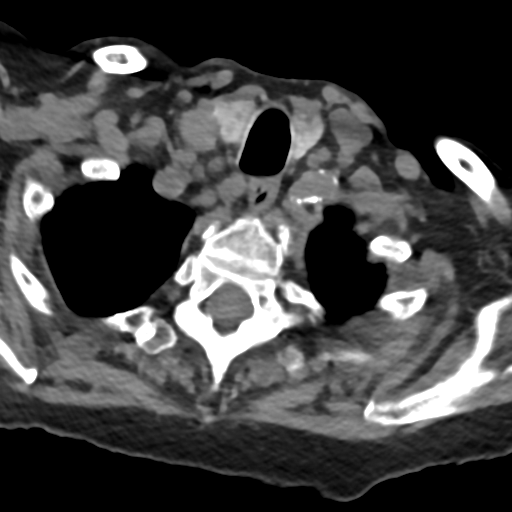
[im 12/76  bone]
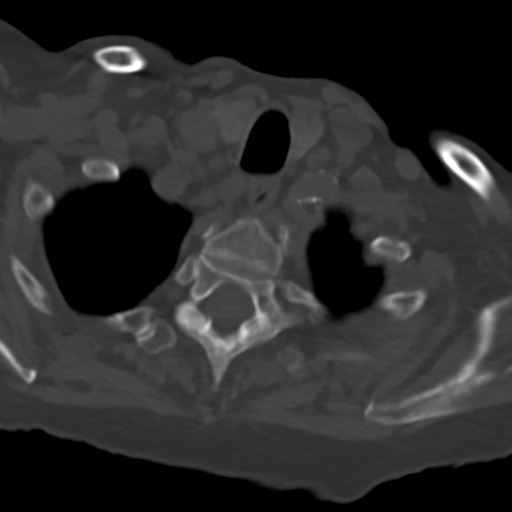
[im 24/76  bone]
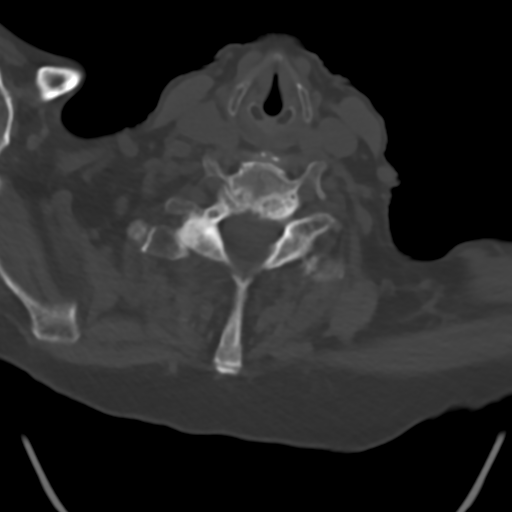
[im 35/76  bone]
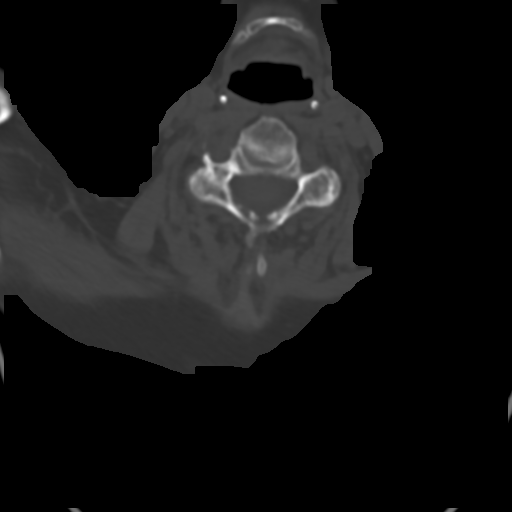
[im 47/76  bone]
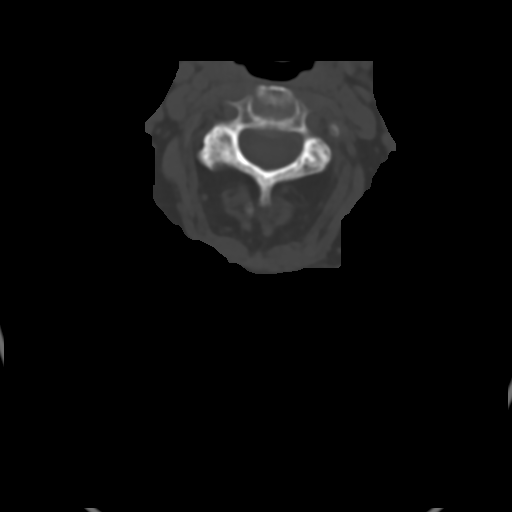
[im 58/76  soft-tissue]
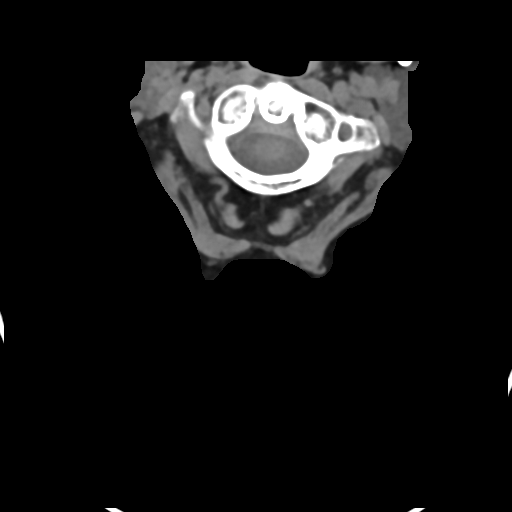
[im 58/76  bone]
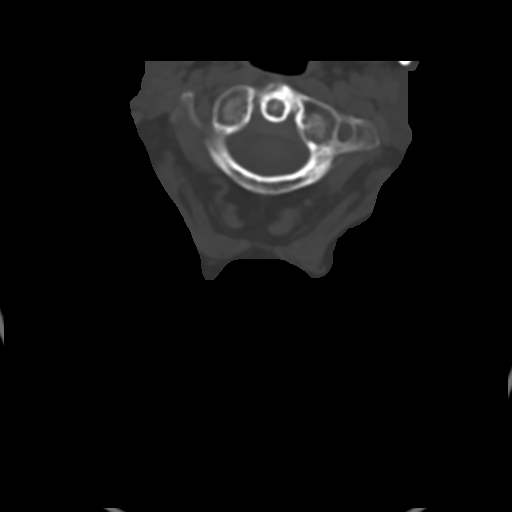
[im 70/76  bone]
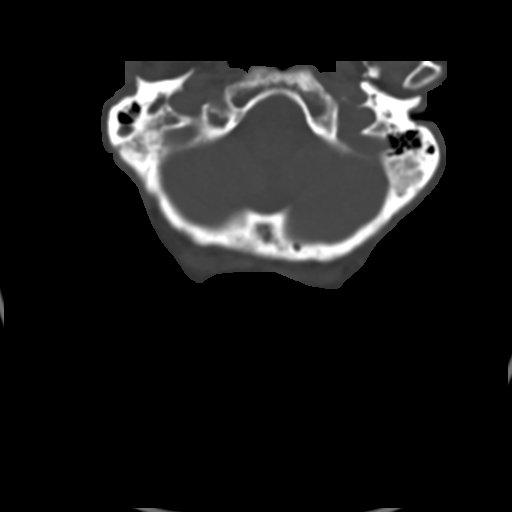

[Series 7: sag bone · sagittal · 0.35mm/px · 5 of 81 slices shown, 6 images]
[im 27/81  bone]
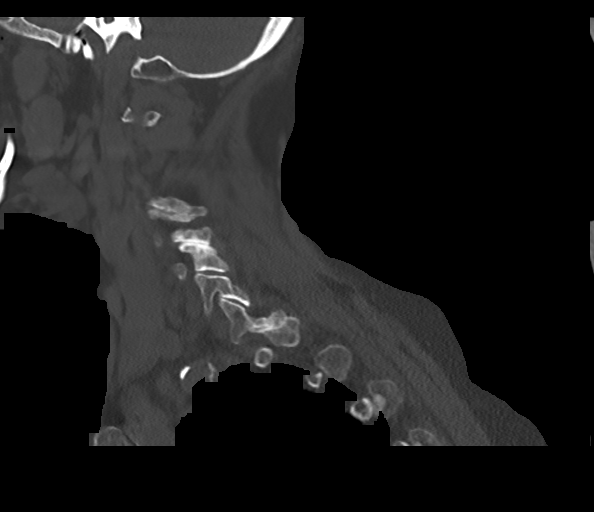
[im 34/81  bone]
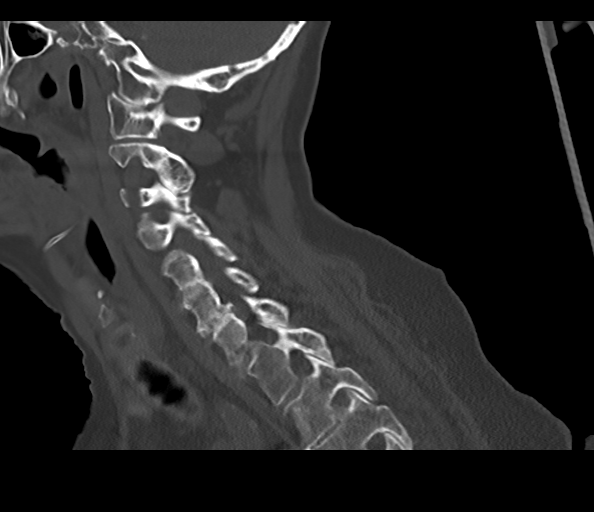
[im 41/81  soft-tissue]
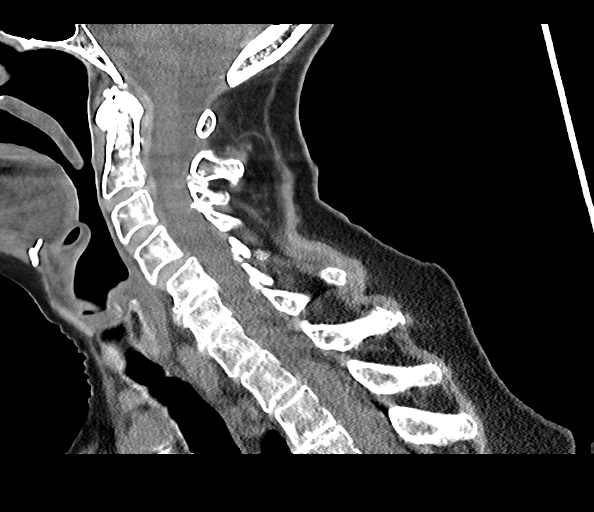
[im 41/81  bone]
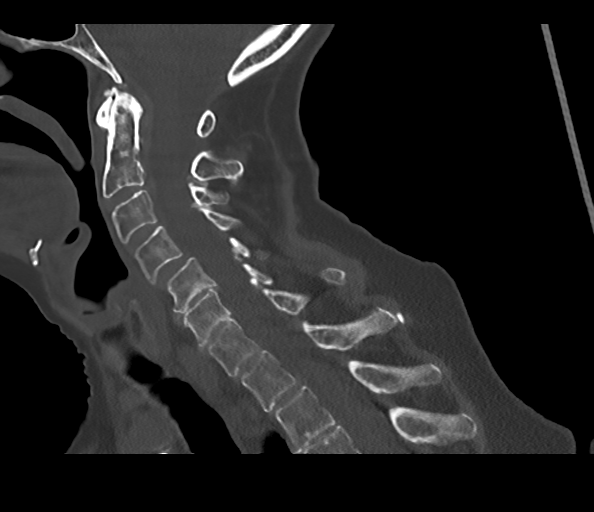
[im 47/81  bone]
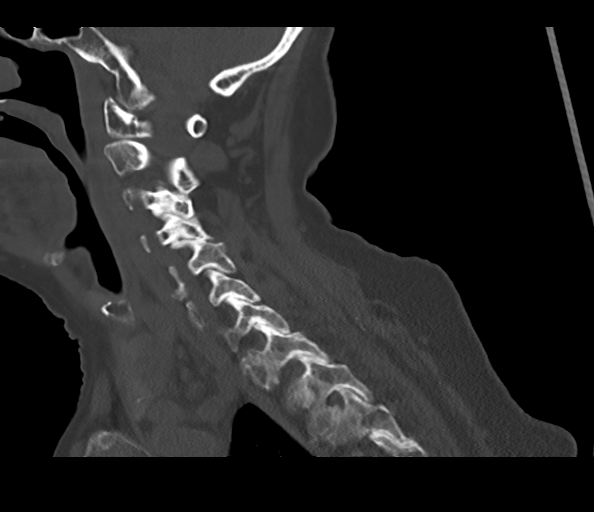
[im 54/81  bone]
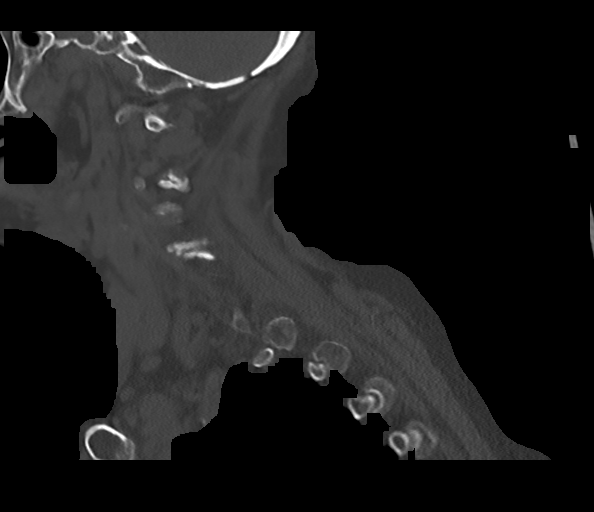

[Series 8: cor bone · coronal · 0.31mm/px · 3 of 106 slices shown]
[im 22/106  bone]
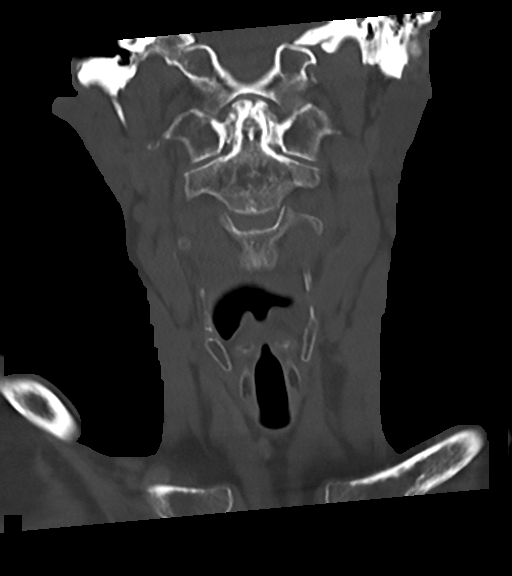
[im 43/106  bone]
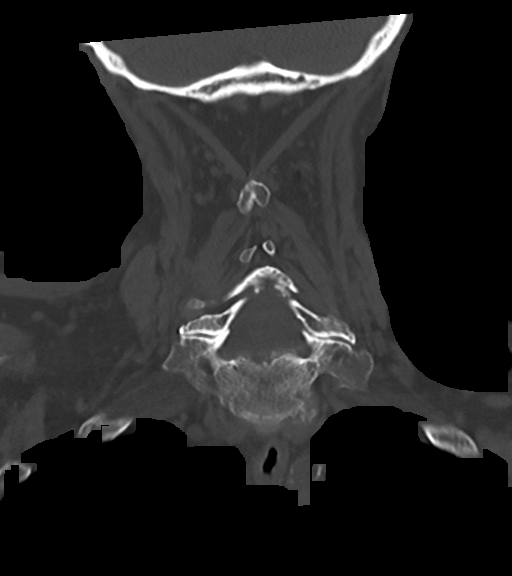
[im 64/106  bone]
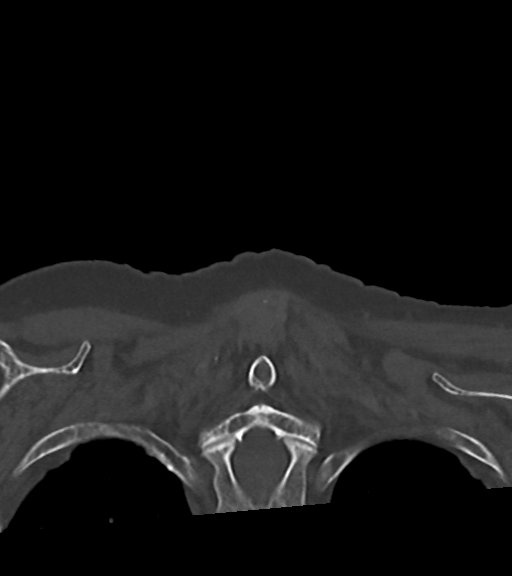

[14 of 34 positions shown; findings below may reference images not displayed]

FINDINGS: CT HEAD FINDINGS

Brain: No evidence of acute large vascular territory infarction,
hemorrhage, hydrocephalus, extra-axial collection or mass
lesion/mass effect. Similar patchy white matter hypoattenuation,
likely relating to chronic microvascular ischemic disease.

Vascular: Calcific atherosclerosis.

Skull: No acute fracture.

Other: No mastoid effusions.

CT MAXILLOFACIAL FINDINGS

Osseous: No fracture or mandibular dislocation. No destructive
process.

Orbits: Negative. No traumatic or inflammatory finding.

Sinuses: Inferior left maxillary sinus in scattered ethmoid air cell
mucosal thickening. Trace frothy secretions in the right sphenoid
sinus.

Soft tissues: Negative.

CT CERVICAL SPINE FINDINGS

Alignment: Similar alignment comparison to prior, including 2 mm of
anterolisthesis of C4 on C5 and trace retrolisthesis of C5 on C6.

Skull base and vertebrae: No evidence acute fracture. Vertebral body
heights are maintained. Osteopenia.

Soft tissues and spinal canal: No prevertebral fluid or swelling. No
visible canal hematoma.

Disc levels: Multilevel intervertebral degenerative change with disc
height loss most pronounced at C5-C6 and C6-C7. Multilevel right
greater than left facet hypertrophy.

Upper chest: Visualized lung apices are clear.

Other: Bilateral carotid atherosclerosis.
IMPRESSION: CT head:

1. No evidence of acute intra cranial abnormality.
2. Similar chronic microvascular ischemic disease and generalized
cerebral volume loss.

CT maxillofacial:

1. No acute fracture.

CT cervical spine:

1. No acute fracture or traumatic malalignment.
2. Similar multilevel degenerative disc disease and facet
arthropathy.

## 2022-06-20 IMAGING — DX DG CHEST 1V PORT
1 series · 1 of 1 positions shown · non-contrast
Comparison: Chest radiograph dated 12/31/2020

CLINICAL DATA: 89-year-old female with cough.

EXAM:
PORTABLE CHEST 1 VIEW

[chest ap]
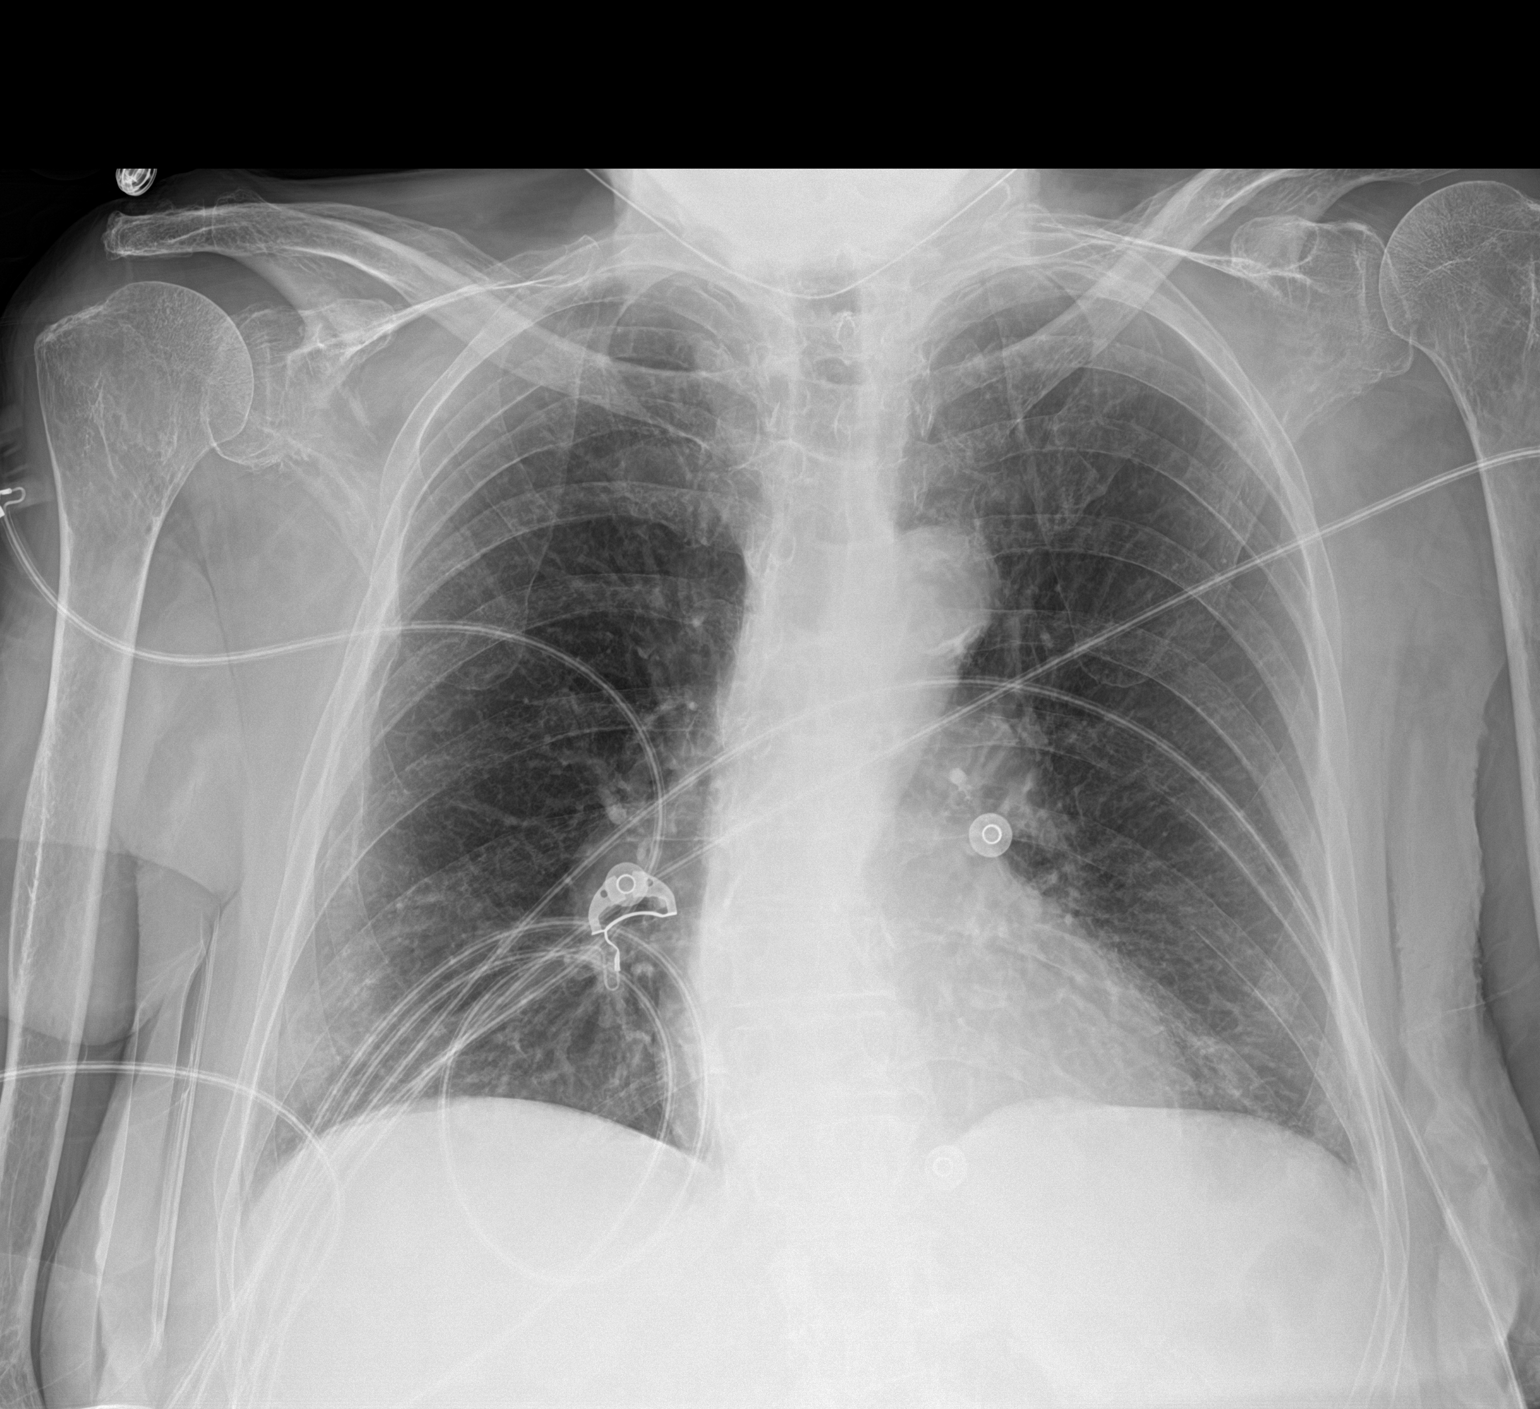

[1 of 1 positions shown; findings below may reference images not displayed]

FINDINGS: No focal consolidation, pleural effusion, pneumothorax. The cardiac
silhouette is within limits. Atherosclerotic calcification of the
aorta. Osteopenia with degenerative changes of the spine. No acute
osseous pathology.
IMPRESSION: No active disease.
